# Patient Record
Sex: Female | Born: 1990 | Race: White | Hispanic: No | Marital: Married | Smoking: Never smoker
Health system: Southern US, Community
[De-identification: ages and names within clinical notes are randomized; demographics above are authoritative.]

## PROBLEM LIST (undated history)

## (undated) DIAGNOSIS — R768 Other specified abnormal immunological findings in serum: Secondary | ICD-10-CM

## (undated) DIAGNOSIS — T7840XA Allergy, unspecified, initial encounter: Secondary | ICD-10-CM

## (undated) HISTORY — PX: UPPER GI ENDOSCOPY: SHX6162

## (undated) HISTORY — DX: Other specified abnormal immunological findings in serum: R76.8

## (undated) HISTORY — DX: Allergy, unspecified, initial encounter: T78.40XA

## (undated) HISTORY — PX: WISDOM TOOTH EXTRACTION: SHX21

---

## 2017-08-04 ENCOUNTER — Other Ambulatory Visit: Payer: Self-pay | Admitting: Obstetrics & Gynecology

## 2017-08-04 NOTE — Telephone Encounter (Signed)
Spoke with pateint and [facility] chart has not arrived, will have chart sent, so refill can be sent. Annual scheduled in August.

## 2017-08-05 NOTE — Telephone Encounter (Signed)
Chart arrived annual on 11/10/17

## 2017-11-10 ENCOUNTER — Encounter: Payer: Self-pay | Admitting: Obstetrics & Gynecology

## 2017-11-10 ENCOUNTER — Ambulatory Visit: Payer: BC Managed Care – PPO | Admitting: Obstetrics & Gynecology

## 2017-11-10 VITALS — BP 110/68 | Ht 65.0 in | Wt 115.2 lb

## 2017-11-10 DIAGNOSIS — Z3041 Encounter for surveillance of contraceptive pills: Secondary | ICD-10-CM

## 2017-11-10 DIAGNOSIS — Z01419 Encounter for gynecological examination (general) (routine) without abnormal findings: Secondary | ICD-10-CM | POA: Diagnosis not present

## 2017-11-10 MED ORDER — NORETHIN-ETH ESTRAD-FE BIPHAS 1 MG-10 MCG / 10 MCG PO TABS: 1.0000 | ORAL_TABLET | Freq: Every day | ORAL | 4 refills | Status: DC

## 2017-11-10 NOTE — Progress Notes (Signed)
Crystal Wagner 1991/03/22 [id number]   History:    27 y.o. G0 Married.  3rd grade teacher.   RP:  Established patient presenting for annual gyn exam   HPI: Frequent nausea on Blisovi 24 Fe 1/20.  Very compliant.  Would like a lower Estradiol pill as she was actually vomiting when on a 30-35 microgram pill in the past.  No BTB.  No pelvic pain.  Urine/BMs wnl.  Breasts wnl.  BMI 19.17.  Gym 4-5 times per week.  Past medical history,surgical history, family history and social history were all reviewed and documented in the EPIC chart.  Gynecologic History Patient's last menstrual period was 10/27/2017. Contraception: OCP (estrogen/progesterone) Last Pap: 05/2016. Results were: Negative Last mammogram: Never Bone Density: Never Colonoscopy: Never  Obstetric History OB History  Gravida Para Term Preterm AB Living  0 0 0 0 0 0  SAB TAB Ectopic Multiple Live Births  0 0 0 0 0     ROS: A ROS was performed and pertinent positives and negatives are included in the history.  GENERAL: No fevers or chills. HEENT: No change in vision, no earache, sore throat or sinus congestion. NECK: No pain or stiffness. CARDIOVASCULAR: No chest pain or pressure. No palpitations. PULMONARY: No shortness of breath, cough or wheeze. GASTROINTESTINAL: No abdominal pain, nausea, vomiting or diarrhea, melena or bright red blood per rectum. GENITOURINARY: No urinary frequency, urgency, hesitancy or dysuria. MUSCULOSKELETAL: No joint or muscle pain, no back pain, no recent trauma. DERMATOLOGIC: No rash, no itching, no lesions. ENDOCRINE: No polyuria, polydipsia, no heat or cold intolerance. No recent change in weight. HEMATOLOGICAL: No anemia or easy bruising or bleeding. NEUROLOGIC: No headache, seizures, numbness, tingling or weakness. PSYCHIATRIC: No depression, no loss of interest in normal activity or change in sleep pattern.     Exam:   BP 110/68   Ht 5\' 5"  (1.651 m)   Wt 115 lb 3.2 oz (52.3 kg)   LMP  10/27/2017 Comment: blisovi  BMI 19.17 kg/m   Body mass index is 19.17 kg/m.  General appearance : Well developed well nourished female. No acute distress HEENT: Eyes: no retinal hemorrhage or exudates,  Neck supple, trachea midline, no carotid bruits, no thyroidmegaly Lungs: Clear to auscultation, no rhonchi or wheezes, or rib retractions  Heart: Regular rate and rhythm, no murmurs or gallops Breast:Examined in sitting and supine position were symmetrical in appearance, no palpable masses or tenderness,  no skin retraction, no nipple inversion, no nipple discharge, no skin discoloration, no axillary or supraclavicular lymphadenopathy Abdomen: no palpable masses or tenderness, no rebound or guarding Extremities: no edema or skin discoloration or tenderness  Pelvic: Vulva: Normal             Vagina: No gross lesions or discharge  Cervix: No gross lesions or discharge.  Pap reflex done.  Uterus  AV, normal size, shape and consistency, non-tender and mobile  Adnexa  Without masses or tenderness  Anus: Normal   Assessment/Plan:  27 y.o. female for annual exam   1. Well female exam with routine gynecological exam  Normal gynecologic exam.  Pap reflex done.  Breast exam normal.   Body mass index 19.17.  Patient is fit and has a healthy nutrition.  2. Encounter for surveillance of contraceptive pills Changed BCPs to Lo LoEstrin Fe because patient had nausea on her 20 microgram pill.  Previously had vomiting on a 30-35 mcg pill.  No contraindication to birth control pills.  Prescription of Lo Loestrin  Fe sent to pharmacy.  Other orders - Norethindrone-Ethinyl Estradiol-Fe Biphas (LO LOESTRIN FE) 1 MG-10 MCG / 10 MCG tablet; Take 1 tablet by mouth daily.  Genia DelMarie-Lyne Sylvanus Telford MD, 3:42 PM 11/10/2017

## 2017-11-10 NOTE — Patient Instructions (Signed)
1. Well female exam with routine gynecological exam  Normal gynecologic exam.  Pap reflex done.  Breast exam normal.   Body mass index 19.17.  Patient is fit and has a healthy nutrition.  2. Encounter for surveillance of contraceptive pills Changed BCPs to Lo LoEstrin Fe because patient had nausea on her 20 microgram pill.  Previously had vomiting on a 30-35 mcg pill.  No contraindication to birth control pills.  Prescription of Lo Loestrin Fe sent to pharmacy.  Other orders - Norethindrone-Ethinyl Estradiol-Fe Biphas (LO LOESTRIN FE) 1 MG-10 MCG / 10 MCG tablet; Take 1 tablet by mouth daily.  Crystal Wagner, it was a pleasure seeing you today!  I will inform you of your results as soon as they are available.

## 2017-11-11 LAB — PAP IG W/ RFLX HPV ASCU

## 2018-01-03 ENCOUNTER — Other Ambulatory Visit: Payer: Self-pay | Admitting: Obstetrics & Gynecology

## 2018-07-23 ENCOUNTER — Other Ambulatory Visit: Payer: Self-pay | Admitting: Obstetrics & Gynecology

## 2018-10-16 ENCOUNTER — Other Ambulatory Visit: Payer: Self-pay | Admitting: Obstetrics & Gynecology

## 2018-11-10 ENCOUNTER — Ambulatory Visit: Payer: BC Managed Care – PPO | Admitting: Obstetrics & Gynecology

## 2018-11-10 ENCOUNTER — Telehealth: Payer: Self-pay | Admitting: *Deleted

## 2018-11-10 ENCOUNTER — Other Ambulatory Visit: Payer: Self-pay

## 2018-11-10 ENCOUNTER — Emergency Department
Admission: EM | Admit: 2018-11-10 | Discharge: 2018-11-10 | Disposition: A | Discharge status: Home / Self Care | Payer: BC Managed Care – PPO | Source: Home / Self Care

## 2018-11-10 DIAGNOSIS — R112 Nausea with vomiting, unspecified: Secondary | ICD-10-CM | POA: Diagnosis not present

## 2018-11-10 DIAGNOSIS — R6883 Chills (without fever): Secondary | ICD-10-CM

## 2018-11-10 DIAGNOSIS — R197 Diarrhea, unspecified: Secondary | ICD-10-CM

## 2018-11-10 MED ORDER — ONDANSETRON 4 MG PO TBDP: 4.0000 mg | ORAL_TABLET | Freq: Three times a day (TID) | ORAL | 0 refills | Status: DC | PRN

## 2018-11-10 MED ORDER — ONDANSETRON 4 MG PO TBDP: 4.0000 mg | ORAL_TABLET | Freq: Once | ORAL | Status: DC

## 2018-11-10 NOTE — Telephone Encounter (Signed)
Patient called c/o nausea and chills x4 days,no temperature this am woke up with bleeding and clots. She said on 11/07/18 had vomiting and her birth pill probably came up due to this and has not been feeling well since then. No missed pill besides the vomiting, negative UPT. She has been taking lo Loestrin 1/10 mcg since 10/2017 and do well with this did mention some nausea in June/July but no vomiting, had negative covid testing on 11/08/18, No PCP. She was not having heavy bleeding now, only when she went to the bathroom, she report the bleeding was heavy then her bleeding before with cycles. No abdominal pain, notes cramping. I advised her to go to urgent care to address the nausea and chills and monitor the bleeding for now could be due the vomiting and pill coming up. She agreed with this and will follow up if needed for the bleeding.

## 2018-11-10 NOTE — ED Triage Notes (Signed)
Pt states that since Sunday night has had nausea, chills.  Flew on Thursday.  Vomited Monday night, diarrhea on Tuesday night.  Tested for COVID on Tuesday, Negative results

## 2018-11-10 NOTE — ED Provider Notes (Signed)
[facility]    CSN: [id number] Arrival date & time: 11/10/18  0947     History   Chief Complaint Chief Complaint  Patient presents with  . Nausea  . Chills  . Fever    HPI Aleicia Kenagy is a 28 y.o. female.   HPI Melodye Swor is a 28 y.o. female presenting to UC with c/o chills, nausea, vomiting and diarrhea that started 3 nights ago with vomiting Monday night, diarrhea Tuesday night. She reports flying out to [place] 1 week ago, returning Thursday 11/03/2018 last week.  She was tested for Covid in a drive-through 2 nights ago, it was negative.  She had one episode of diarrhea last night but no vomiting or diarrhea since then.  Denies fever, cough, congestion, abdominal pain or urinary symptoms. LMP was about 1 month ago but she has been spotting the last few days, she believes due to vomiting her birth control.  She took a home pregnancy test, which was negative.  No known sick contacts.    History reviewed. No pertinent past medical history.  There are no active problems to display for this patient.   Past Surgical History:  Procedure Laterality Date  . WISDOM TOOTH EXTRACTION      OB History    Gravida  0   Para  0   Term  0   Preterm  0   AB  0   Living  0     SAB  0   TAB  0   Ectopic  0   Multiple  0   Live Births  0            Home Medications    Prior to Admission medications   Medication Sig Start Date End Date Taking? Authorizing Provider  BLISOVI 24 FE 1-20 MG-MCG(24) tablet TAKE 1 TABLET BY MOUTH EVERY DAY 07/25/18   Princess Bruins, MD  LO LOESTRIN FE 1 MG-10 MCG / 10 MCG tablet TAKE 1 TABLET BY MOUTH EVERY DAY 10/17/18   Princess Bruins, MD  ondansetron (ZOFRAN ODT) 4 MG disintegrating tablet Take 1 tablet (4 mg total) by mouth every 8 (eight) hours as needed. 11/10/18   Noe Gens, PA-C    Family History Family History  Problem Relation Age of Onset  . Diabetes Maternal Aunt     Social History  Social History   Tobacco Use  . Smoking status: Never Smoker  . Smokeless tobacco: Never Used  Substance Use Topics  . Alcohol use: Yes    Comment: occ  . Drug use: Not on file     Allergies   Patient has no known allergies.   Review of Systems Review of Systems  Constitutional: Positive for chills. Negative for fever.  HENT: Negative for congestion, ear pain, sneezing and sore throat.   Respiratory: Negative for cough and chest tightness.   Gastrointestinal: Positive for diarrhea, nausea and vomiting. Negative for abdominal pain.  Genitourinary: Negative for dysuria, flank pain, frequency and hematuria.  Musculoskeletal: Negative for arthralgias, back pain and myalgias.  Neurological: Negative for dizziness, light-headedness and headaches.     Physical Exam Triage Vital Signs ED Triage Vitals  Enc Vitals Group     BP 11/10/18 1027 108/77     Pulse Rate 11/10/18 1027 100     Resp 11/10/18 1027 20     Temp 11/10/18 1027 98.2 F (36.8 C)     Temp Source 11/10/18 1027 Oral     SpO2 11/10/18 1027  98 %     Weight 11/10/18 1030 115 lb (52.2 kg)     Height 11/10/18 1030 5\' 5"  (1.651 m)     Head Circumference --      Peak Flow --      Pain Score 11/10/18 1029 2     Pain Loc --      Pain Edu? --      Excl. in GC? --    No data found.  Updated Vital Signs BP 108/77 (BP Location: Right Arm)   Pulse 100   Temp 98.2 F (36.8 C) (Oral)   Resp 20   Ht 5\' 5"  (1.651 m)   Wt 115 lb (52.2 kg)   LMP 11/10/2018   SpO2 98%   BMI 19.14 kg/m   Visual Acuity Right Eye Distance:   Left Eye Distance:   Bilateral Distance:    Right Eye Near:   Left Eye Near:    Bilateral Near:     Physical Exam Vitals signs and nursing note reviewed.  Constitutional:      Appearance: Normal appearance. She is well-developed.  HENT:     Head: Normocephalic and atraumatic.     Mouth/Throat:     Mouth: Mucous membranes are moist.  Eyes:     General: No scleral icterus.     Extraocular Movements: Extraocular movements intact.     Conjunctiva/sclera: Conjunctivae normal.  Neck:     Musculoskeletal: Normal range of motion.  Cardiovascular:     Rate and Rhythm: Normal rate and regular rhythm.  Pulmonary:     Effort: Pulmonary effort is normal. No respiratory distress.     Breath sounds: Normal breath sounds. No stridor. No wheezing, rhonchi or rales.  Abdominal:     General: There is no distension.     Palpations: Abdomen is soft.     Tenderness: There is no abdominal tenderness. There is no right CVA tenderness or left CVA tenderness.  Musculoskeletal: Normal range of motion.  Skin:    General: Skin is warm and dry.  Neurological:     Mental Status: She is alert and oriented to person, place, and time.  Psychiatric:        Behavior: Behavior normal.      UC Treatments / Results  Labs (all labs ordered are listed, but only abnormal results are displayed) Labs Reviewed - No data to display  EKG   Radiology No results found.  Procedures Procedures (including critical care time)  Medications Ordered in UC Medications - No data to display  Initial Impression / Assessment and Plan / UC Course  I have reviewed the triage vital signs and the nursing notes.  Pertinent labs & imaging results that were available during my care of the patient were reviewed by me and considered in my medical decision making (see chart for details).     Pt appears well, NAD Benign abdominal exam. Reassuring symptoms have been improving. No vomiting or diarrhea today. Encouraged good hydration. Offered to repeat Covid or pregnancy test. Pt declined. AVS provided F/u as needed.  Final Clinical Impressions(s) / UC Diagnoses   Final diagnoses:  Nausea vomiting and diarrhea  Chills (without fever)     Discharge Instructions      No Primary Care Doctor: - Call [facility] at  [phone] - they can help you locate a primary care doctor that  accepts your  insurance, provides certain services, etc. - Physician Referral Service- [phone]   Be sure to get a lot  of rest and stay well hydrated with sports drinks, water, diluted juices, and clear sodas.  Avoid fried fatty food, spicy food, and milk as these foods can cause worsening stomach upset.   Please follow up in 3-4 days if still not improving, sooner if symptoms worsening or new symptoms develop- fever, abdominal pain, urinary symptoms, dizziness/passing out.   If you are still concerned for pregnancy or Covid, it is recommended you are retested in 3-4 days as tests should be more accurate by this time.       ED Prescriptions    Medication Sig Dispense Auth. Provider   ondansetron (ZOFRAN ODT) 4 MG disintegrating tablet  (Status: Discontinued) Take 1 tablet (4 mg total) by mouth every 8 (eight) hours as needed. 8 tablet Doroteo GlassmanPhelps, Khira Cudmore O, PA-C   ondansetron (ZOFRAN ODT) 4 MG disintegrating tablet Take 1 tablet (4 mg total) by mouth every 8 (eight) hours as needed. 8 tablet Lurene ShadowPhelps, Persephone Schriever O, PA-C     Controlled Substance Prescriptions [place] Controlled Substance Registry consulted? Not Applicable   Rolla Platehelps, Dannah Ryles O, PA-C 11/10/18 1426

## 2018-11-10 NOTE — Discharge Instructions (Signed)
°  No Primary Care Doctor: Call [facility] at  [phone] - they can help you locate a primary care doctor that  accepts your insurance, provides certain services, etc. Physician Referral Service- [phone]   Be sure to get a lot of rest and stay well hydrated with sports drinks, water, diluted juices, and clear sodas.  Avoid fried fatty food, spicy food, and milk as these foods can cause worsening stomach upset.   Please follow up in 3-4 days if still not improving, sooner if symptoms worsening or new symptoms develop- fever, abdominal pain, urinary symptoms, dizziness/passing out.   If you are still concerned for pregnancy or Covid, it is recommended you are retested in 3-4 days as tests should be more accurate by this time.

## 2018-11-14 ENCOUNTER — Ambulatory Visit (INDEPENDENT_AMBULATORY_CARE_PROVIDER_SITE_OTHER): Payer: BC Managed Care – PPO | Admitting: Nurse Practitioner

## 2018-11-14 ENCOUNTER — Other Ambulatory Visit: Payer: Self-pay

## 2018-11-14 ENCOUNTER — Encounter: Payer: Self-pay | Admitting: Nurse Practitioner

## 2018-11-14 VITALS — Temp 97.5°F | Ht 65.0 in | Wt 108.8 lb

## 2018-11-14 DIAGNOSIS — K529 Noninfective gastroenteritis and colitis, unspecified: Secondary | ICD-10-CM

## 2018-11-14 MED ORDER — DIPHENOXYLATE-ATROPINE 2.5-0.025 MG/5ML PO LIQD: 5.0000 mL | Freq: Four times a day (QID) | ORAL | 0 refills | Status: DC | PRN

## 2018-11-14 NOTE — Progress Notes (Signed)
Virtual Visit via Video Note  I connected with Crystal Wagner on 11/14/18 at 11:00 AM EDT by a video enabled telemedicine application and verified that I am speaking with the correct person using two identifiers.  Location: Patient: home Provider: office   I discussed the limitations of evaluation and management by telemedicine and the availability of in person appointments. The patient expressed understanding and agreed to proceed.  CC: diarrhea, nausea, ABD pain x 1week.  History of Present Illness: GI Problem The primary symptoms include abdominal pain, nausea and diarrhea. Primary symptoms do not include fever, weight loss, fatigue, vomiting, melena, hematemesis, jaundice, hematochezia, dysuria, myalgias, arthralgias or rash. The illness began 6 to 7 days ago. The onset was gradual. The problem has not changed since onset. The abdominal pain began more than 2 days ago. The abdominal pain has been unchanged since its onset. The abdominal pain is generalized. The abdominal pain does not radiate. Eating relieves the abdominal pain.  Nausea began 6 to 7 days ago. The nausea is associated with eating.  The diarrhea began 6 to 7 days ago. The diarrhea is watery. The diarrhea occurs 2 to 4 times per day.  The illness does not include chills, anorexia, bloating, tenesmus or back pain. Associated medical issues do not include inflammatory bowel disease, GERD, gallstones, liver disease, alcohol abuse, PUD, gastric bypass, irritable bowel syndrome, hemorrhoids or diverticulitis.  negative COVID test. No known sick contact. Use of zofran as needed with some relief. No antidiarrhea medication used.   Reviewed medical, family, surgical and social history.  Observations/Objective: Physical Exam  Constitutional: She is oriented to person, place, and time. No distress.  Pulmonary/Chest: Effort normal.  Abdominal: She exhibits no distension.  Neurological: She is alert and oriented to person,  place, and time.  Skin: No rash noted.   Assessment and Plan: Patina was seen today for establish care.  Diagnoses and all orders for this visit:  Gastroenteritis -     diphenoxylate-atropine (LOMOTIL) 2.5-0.025 MG/5ML liquid; Take 5 mLs by mouth 4 (four) times daily as needed for diarrhea or loose stools.   Follow Up Instructions: See avs   I discussed the assessment and treatment plan with the patient. The patient was provided an opportunity to ask questions and all were answered. The patient agreed with the plan and demonstrated an understanding of the instructions.   The patient was advised to call back or seek an in-person evaluation if the symptoms worsen or if the condition fails to improve as anticipated.   Wilfred Lacy, NP

## 2018-11-14 NOTE — Patient Instructions (Signed)
Maintain adequate oral hydration and BRAT diet for 3days. Slowly advance diet as tolerated.  Viral Gastroenteritis, Adult  Viral gastroenteritis is also known as the stomach flu. This condition may affect your stomach, your small intestine, and your large intestine. It can cause sudden watery poop (diarrhea), fever, and throwing up (vomiting). This condition is caused by certain germs (viruses). These germs can be passed from person to person very easily (are contagious). Having watery poop and throwing up can make you feel weak and cause you to not have enough water in your body (get dehydrated). This can make you tired and thirsty, make you have a dry mouth, and make it so you pee (urinate) less often. It is important to replace the fluids that you lose from having watery poop and throwing up. What are the causes?  You can get sick by catching viruses from other people.  You can also get sick by: ? Eating food, drinking water, or touching a surface that has the viruses on it (is contaminated). ? Sharing utensils or other personal items with a person who is sick. What increases the risk?  Having a weak body defense system (immune system).  Living with one or more children who are younger than 27 years old.  Living in a nursing home.  Going on cruise ships. What are the signs or symptoms? Symptoms of this condition start suddenly. Symptoms may last for a few days or for as long as a week.  Common symptoms include: ? Watery poop. ? Throwing up.  Other symptoms include: ? Fever. ? Headache. ? Feeling tired (fatigue). ? Pain in the belly (abdomen). ? Chills. ? Feeling weak. ? Feeling sick to your stomach (nauseous). ? Muscle aches. ? Not feeling hungry. How is this treated?  This condition typically goes away on its own.  The focus of treatment is to replace the fluids that you lose. This condition may be treated with: ? An ORS (oral rehydration solution). This is a drink  that is sold at pharmacies and stores. ? Medicines to help with your symptoms. ? Probiotic supplements to reduce symptoms of diarrhea. ? Fluids given through an IV tube, if needed.  Older adults and people with other diseases or a weak body defense system are at higher risk for not having enough water in the body. Follow these instructions at home: Eating and drinking   Take an ORS as told by your doctor.  Drink clear fluids in small amounts as you are able. Clear fluids include: ? Water. ? Ice chips. ? Fruit juice with water added to it (diluted). ? Low-calorie sports drinks.  Drink enough fluid to keep your pee (urine) pale yellow.  Eat small amounts of healthy foods every 3-4 hours as you are able. This may include whole grains, fruits, vegetables, lean meats, and yogurt.  Avoid fluids that have a lot of sugar or caffeine in them, such as energy drinks, sports drinks, and soda.  Avoid spicy or fatty foods.  Avoid alcohol. General instructions   Wash your hands often. This is very important after you have watery poop or you throw up. If you cannot use soap and water, use hand sanitizer.  Make sure that all people in your home wash their hands well and often.  Take over-the-counter and prescription medicines only as told by your doctor.  Rest at home while you get better.  Watch your condition for any changes.  Take a warm bath to help with any burning or  pain from having watery poop.  Keep all follow-up visits as told by your doctor. This is important. Contact a doctor if:  You cannot keep fluids down.  Your symptoms get worse.  You have new symptoms.  You feel light-headed.  You feel dizzy.  You have muscle cramps. Get help right away if:  You have chest pain.  You feel very weak.  You pass out (faint).  You see blood in your throw-up.  Your throw-up looks like coffee grounds.  You have bloody or black poop (stools) or poop that looks like tar.   You have a very bad headache, or a stiff neck, or both.  You have a rash.  You have very bad pain, cramping, or bloating in your belly.  You have trouble breathing.  You are breathing very quickly.  You have a fast heartbeat.  Your skin feels cold and clammy.  You feel mixed up (confused).  You have pain when you pee.  You have signs of not having enough water in the body, such as: ? Dark pee, hardly any pee, or no pee. ? Cracked lips. ? Dry mouth. ? Sunken eyes. ? Feeling very sleepy. ? Feeling weak. Summary  Viral gastroenteritis is also known as the stomach flu.  This condition can cause sudden watery poop (diarrhea), fever, and throwing up (vomiting).  These germs can be passed from person to person very easily.  Take an ORS as told by your doctor. This is a drink that is sold at pharmacies and stores.  Drink fluids in small amounts many times each day as you are able. This information is not intended to replace advice given to you by your health care provider. Make sure you discuss any questions you have with your health care provider. Document Released: 09/02/2007 Document Revised: 01/19/2018 Document Reviewed: 01/19/2018 Elsevier Patient Education  2020 [employer]    Food Choices to Help Relieve Diarrhea, Adult When you have diarrhea, the foods you eat and your eating habits are very important. Choosing the right foods and drinks can help:  Relieve diarrhea.  Replace lost fluids and nutrients.  Prevent dehydration. What general guidelines should I follow?  Relieving diarrhea  Choose foods with less than 2 g or .07 oz. of fiber per serving.  Limit fats to less than 8 tsp (38 g or 1.34 oz.) a day.  Avoid the following: ? Foods and beverages sweetened with high-fructose corn syrup, honey, or sugar alcohols such as xylitol, sorbitol, and mannitol. ? Foods that contain a lot of fat or sugar. ? Fried, greasy, or spicy foods. ? High-fiber grains,  breads, and cereals. ? Raw fruits and vegetables.  Eat foods that are rich in probiotics. These foods include dairy products such as yogurt and fermented milk products. They help increase healthy bacteria in the stomach and intestines (gastrointestinal tract, or GI tract).  If you have lactose intolerance, avoid dairy products. These may make your diarrhea worse.  Take medicine to help stop diarrhea (antidiarrheal medicine) only as told by your health care provider. Replacing nutrients  Eat small meals or snacks every 3-4 hours.  Eat bland foods, such as white rice, toast, or baked potato, until your diarrhea starts to get better. Gradually reintroduce nutrient-rich foods as tolerated or as told by your health care provider. This includes: ? Well-cooked protein foods. ? Peeled, seeded, and soft-cooked fruits and vegetables. ? Low-fat dairy products.  Take vitamin and mineral supplements as told by your health care provider. Preventing  dehydration  Start by sipping water or a special solution to prevent dehydration (oral rehydration solution, ORS). Urine that is clear or pale yellow means that you are getting enough fluid.  Try to drink at least 8-10 cups of fluid each day to help replace lost fluids.  You may add other liquids in addition to water, such as clear juice or decaffeinated sports drinks, as tolerated or as told by your health care provider.  Avoid drinks with caffeine, such as coffee, tea, or soft drinks.  Avoid alcohol. What foods are recommended?     The items listed may not be a complete list. Talk with your health care provider about what dietary choices are best for you. Grains White rice. White, [place]French, or pita breads (fresh or toasted), including plain rolls, buns, or bagels. White pasta. Saltine, soda, or graham crackers. Pretzels. Low-fiber cereal. Cooked cereals made with water (such as cornmeal, farina, or cream cereals). Plain muffins. Matzo. Melba toast.  Zwieback. Vegetables Potatoes (without the skin). Most well-cooked and canned vegetables without skins or seeds. Tender lettuce. Fruits Apple sauce. Fruits canned in juice. Cooked apricots, cherries, grapefruit, peaches, pears, or plums. Fresh bananas and cantaloupe. Meats and other protein foods Baked or boiled chicken. Eggs. Tofu. Fish. Seafood. Smooth nut butters. Ground or well-cooked tender beef, ham, veal, lamb, pork, or poultry. Dairy Plain yogurt, kefir, and unsweetened liquid yogurt. Lactose-free milk, buttermilk, skim milk, or soy milk. Low-fat or nonfat hard cheese. Beverages Water. Low-calorie sports drinks. Fruit juices without pulp. Strained tomato and vegetable juices. Decaffeinated teas. Sugar-free beverages not sweetened with sugar alcohols. Oral rehydration solutions, if approved by your health care provider. Seasoning and other foods Bouillon, broth, or soups made from recommended foods. What foods are not recommended? The items listed may not be a complete list. Talk with your health care provider about what dietary choices are best for you. Grains Whole grain, whole wheat, bran, or rye breads, rolls, pastas, and crackers. Wild or brown rice. Whole grain or bran cereals. Barley. Oats and oatmeal. Corn tortillas or taco shells. Granola. Popcorn. Vegetables Raw vegetables. Fried vegetables. Cabbage, broccoli, Brussels sprouts, artichokes, baked beans, beet greens, corn, kale, legumes, peas, sweet potatoes, and yams. Potato skins. Cooked spinach and cabbage. Fruits Dried fruit, including raisins and dates. Raw fruits. Stewed or dried prunes. Canned fruits with syrup. Meat and other protein foods Fried or fatty meats. Deli meats. Chunky nut butters. Nuts and seeds. Beans and lentils. Tomasa BlaseBacon. Hot dogs. Sausage. Dairy High-fat cheeses. Whole milk, chocolate milk, and beverages made with milk, such as milk shakes. Half-and-half. Cream. sour cream. Ice cream. Beverages  Caffeinated beverages (such as coffee, tea, soda, or energy drinks). Alcoholic beverages. Fruit juices with pulp. Prune juice. Soft drinks sweetened with high-fructose corn syrup or sugar alcohols. High-calorie sports drinks. Fats and oils Butter. Cream sauces. Margarine. Salad oils. Plain salad dressings. Olives. Avocados. Mayonnaise. Sweets and desserts Sweet rolls, doughnuts, and sweet breads. Sugar-free desserts sweetened with sugar alcohols such as xylitol and sorbitol. Seasoning and other foods Honey. Hot sauce. Chili powder. Gravy. Cream-based or milk-based soups. Pancakes and waffles. Summary  When you have diarrhea, the foods you eat and your eating habits are very important.  Make sure you get at least 8-10 cups of fluid each day, or enough to keep your urine clear or pale yellow.  Eat bland foods and gradually reintroduce healthy, nutrient-rich foods as tolerated, or as told by your health care provider.  Avoid high-fiber, fried, greasy, or spicy  foods. This information is not intended to replace advice given to you by your health care provider. Make sure you discuss any questions you have with your health care provider. Document Released: 06/06/2003 Document Revised: 07/07/2018 Document Reviewed: 03/13/2016 Elsevier Patient Education  2020 [employer].

## 2018-11-15 ENCOUNTER — Encounter: Payer: Self-pay | Admitting: Nurse Practitioner

## 2018-11-16 ENCOUNTER — Encounter: Payer: BC Managed Care – PPO | Admitting: Obstetrics & Gynecology

## 2018-11-21 ENCOUNTER — Other Ambulatory Visit: Payer: BC Managed Care – PPO

## 2018-11-21 ENCOUNTER — Other Ambulatory Visit: Payer: Self-pay

## 2018-11-21 ENCOUNTER — Telehealth (INDEPENDENT_AMBULATORY_CARE_PROVIDER_SITE_OTHER): Payer: BC Managed Care – PPO | Admitting: Nurse Practitioner

## 2018-11-21 ENCOUNTER — Encounter: Payer: Self-pay | Admitting: Nurse Practitioner

## 2018-11-21 VITALS — Temp 97.5°F | Ht 65.0 in | Wt 106.2 lb

## 2018-11-21 DIAGNOSIS — R112 Nausea with vomiting, unspecified: Secondary | ICD-10-CM | POA: Diagnosis not present

## 2018-11-21 DIAGNOSIS — R1084 Generalized abdominal pain: Secondary | ICD-10-CM | POA: Diagnosis not present

## 2018-11-21 LAB — CBC WITH DIFFERENTIAL/PLATELET
Basophils Absolute: 0.1 10*3/uL (ref 0.0–0.1)
Basophils Relative: 1.1 % (ref 0.0–3.0)
Eosinophils Absolute: 0 10*3/uL (ref 0.0–0.7)
Eosinophils Relative: 0.5 % (ref 0.0–5.0)
HCT: 41.9 % (ref 36.0–46.0)
Hemoglobin: 13.9 g/dL (ref 12.0–15.0)
Lymphocytes Relative: 22.8 % (ref 12.0–46.0)
Lymphs Abs: 1.3 10*3/uL (ref 0.7–4.0)
MCHC: 33.3 g/dL (ref 30.0–36.0)
MCV: 86.1 fl (ref 78.0–100.0)
Monocytes Absolute: 0.3 10*3/uL (ref 0.1–1.0)
Monocytes Relative: 5.3 % (ref 3.0–12.0)
Neutro Abs: 4 10*3/uL (ref 1.4–7.7)
Neutrophils Relative %: 70.3 % (ref 43.0–77.0)
Platelets: 243 10*3/uL (ref 150.0–400.0)
RBC: 4.86 Mil/uL (ref 3.87–5.11)
RDW: 12.4 % (ref 11.5–15.5)
WBC: 5.7 10*3/uL (ref 4.0–10.5)

## 2018-11-21 LAB — COMPREHENSIVE METABOLIC PANEL
ALT: 22 U/L (ref 0–35)
AST: 17 U/L (ref 0–37)
Albumin: 4.7 g/dL (ref 3.5–5.2)
Alkaline Phosphatase: 35 U/L — ABNORMAL LOW (ref 39–117)
BUN: 13 mg/dL (ref 6–23)
CO2: 27 mEq/L (ref 19–32)
Calcium: 9.3 mg/dL (ref 8.4–10.5)
Chloride: 102 mEq/L (ref 96–112)
Creatinine, Ser: 0.91 mg/dL (ref 0.40–1.20)
GFR: 74.14 mL/min (ref >60.00)
Glucose, Bld: 112 mg/dL — ABNORMAL HIGH (ref 70–99)
Potassium: 3.7 mEq/L (ref 3.5–5.1)
Sodium: 137 mEq/L (ref 135–145)
Total Bilirubin: 0.4 mg/dL (ref 0.2–1.2)
Total Protein: 7 g/dL (ref 6.0–8.3)

## 2018-11-21 LAB — LIPASE: Lipase: 6 U/L — ABNORMAL LOW (ref 11.0–59.0)

## 2018-11-21 MED ORDER — FAMOTIDINE 20 MG PO TABS: 20.0000 mg | ORAL_TABLET | Freq: Two times a day (BID) | ORAL | 0 refills | Status: DC

## 2018-11-21 MED ORDER — ONDANSETRON 4 MG PO TBDP: 4.0000 mg | ORAL_TABLET | Freq: Three times a day (TID) | ORAL | 0 refills | Status: DC | PRN

## 2018-11-21 NOTE — Patient Instructions (Addendum)
Go to lab at 11am for blood draw. You will be contacted to schedule ABD [place]. Zofran and Pepcid sent to your pharmacy. Continue bland diet.

## 2018-11-21 NOTE — Progress Notes (Signed)
Virtual Visit via Video Note  I connected with Quita Skye on 11/21/18 at  9:00 AM EDT by a video enabled telemedicine application and verified that I am speaking with the correct person using two identifiers.  Location: Patient: home Provider: office   I discussed the limitations of evaluation and management by telemedicine and the availability of in person appointments. The patient expressed understanding and agreed to proceed.  CC:pt is c/o abd pain,cant eat,nausea, throat fullness/2 wks/[facility]--got zofran--FYI--just came back from [facility] 8/1-8/6. and tested negataive for covid 11/08/2018   History of Present Illness: Abdominal Pain This is a new problem. The current episode started 1 to 4 weeks ago. The onset quality is gradual. The problem occurs constantly. The problem has been unchanged. The pain is located in the generalized abdominal region. The quality of the pain is colicky and a sensation of fullness. The abdominal pain does not radiate. Associated symptoms include anorexia, nausea and vomiting. Pertinent negatives include no constipation, diarrhea, fever, frequency, headaches, hematochezia, hematuria, melena, myalgias or weight loss. The pain is aggravated by eating. The pain is relieved by nothing. Treatments tried: zofran. The treatment provided mild relief. There is no history of gallstones, GERD, irritable bowel syndrome or PUD.  denies any Upper respiratory symptoms, resolved diarrhea, no loss of taste or smell. Has chills and clammy sensation without fever. Able to maintain adequate oral hydration.   Observations/Objective: Physical Exam  Constitutional: She is oriented to person, place, and time. No distress.  Pulmonary/Chest: Effort normal.  Abdominal: She exhibits no distension.  Neurological: She is alert and oriented to person, place, and time.  Psychiatric: She has a normal mood and affect. Her behavior is normal.   Assessment and Plan: Aalani  was seen today for abdominal pain.  Diagnoses and all orders for this visit:  Non-intractable vomiting with nausea, unspecified vomiting type -     ondansetron (ZOFRAN ODT) 4 MG disintegrating tablet; Take 1 tablet (4 mg total) by mouth every 8 (eight) hours as needed. -     famotidine (PEPCID) 20 MG tablet; Take 1 tablet (20 mg total) by mouth 2 (two) times daily. -     CBC w/Diff -     Comprehensive metabolic panel -     Lipase -     US Abdomen Limited RUQ; Future  Generalized abdominal pain -     ondansetron (ZOFRAN ODT) 4 MG disintegrating tablet; Take 1 tablet (4 mg total) by mouth every 8 (eight) hours as needed. -     famotidine (PEPCID) 20 MG tablet; Take 1 tablet (20 mg total) by mouth 2 (two) times daily. -     US Abdomen Limited RUQ; Future    Follow Up Instructions: See avs   I discussed the assessment and treatment plan with the patient. The patient was provided an opportunity to ask questions and all were answered. The patient agreed with the plan and demonstrated an understanding of the instructions.   The patient was advised to call back or seek an in-person evaluation if the symptoms worsen or if the condition fails to improve as anticipated.  Wilfred Lacy, NP

## 2018-11-22 ENCOUNTER — Encounter: Payer: Self-pay | Admitting: Nurse Practitioner

## 2018-11-23 ENCOUNTER — Encounter: Payer: Self-pay | Admitting: Nurse Practitioner

## 2018-11-23 ENCOUNTER — Ambulatory Visit (HOSPITAL_COMMUNITY)
Admission: RE | Admit: 2018-11-23 | Discharge: 2018-11-23 | Disposition: A | Discharge status: Home / Self Care | Payer: BC Managed Care – PPO | Source: Ambulatory Visit | Attending: Nurse Practitioner | Admitting: Nurse Practitioner

## 2018-11-23 ENCOUNTER — Other Ambulatory Visit: Payer: Self-pay

## 2018-11-23 DIAGNOSIS — R112 Nausea with vomiting, unspecified: Secondary | ICD-10-CM | POA: Insufficient documentation

## 2018-11-23 DIAGNOSIS — R1084 Generalized abdominal pain: Secondary | ICD-10-CM | POA: Insufficient documentation

## 2018-11-27 ENCOUNTER — Encounter: Payer: Self-pay | Admitting: Nurse Practitioner

## 2018-11-30 ENCOUNTER — Encounter: Payer: BC Managed Care – PPO | Admitting: Nurse Practitioner

## 2018-12-07 ENCOUNTER — Encounter: Payer: BC Managed Care – PPO | Admitting: Nurse Practitioner

## 2018-12-07 ENCOUNTER — Telehealth: Payer: Self-pay | Admitting: Nurse Practitioner

## 2018-12-07 NOTE — Telephone Encounter (Signed)
Tried to call but had to leave message

## 2018-12-07 NOTE — Telephone Encounter (Signed)
-----   Message from Shawnie Pons, LPN sent at 05/06/6145  8:30 AM EDT ----- Please help call the pt and get CPE reschedule.   Thank you.

## 2018-12-12 ENCOUNTER — Encounter: Payer: Self-pay | Admitting: Nurse Practitioner

## 2018-12-12 ENCOUNTER — Ambulatory Visit: Payer: BC Managed Care – PPO | Admitting: Nurse Practitioner

## 2018-12-12 ENCOUNTER — Other Ambulatory Visit: Payer: Self-pay

## 2018-12-12 VITALS — BP 98/68 | HR 108 | Temp 97.9°F | Ht 65.0 in | Wt 108.0 lb

## 2018-12-12 DIAGNOSIS — R1013 Epigastric pain: Secondary | ICD-10-CM | POA: Diagnosis not present

## 2018-12-12 DIAGNOSIS — R112 Nausea with vomiting, unspecified: Secondary | ICD-10-CM

## 2018-12-12 LAB — POCT URINE PREGNANCY: Preg Test, Ur: NEGATIVE

## 2018-12-12 MED ORDER — ONDANSETRON 4 MG PO TBDP: 4.0000 mg | ORAL_TABLET | Freq: Three times a day (TID) | ORAL | 0 refills | Status: DC | PRN

## 2018-12-12 MED ORDER — FAMOTIDINE 20 MG PO TABS: 20.0000 mg | ORAL_TABLET | Freq: Two times a day (BID) | ORAL | 5 refills | Status: DC

## 2018-12-12 NOTE — Patient Instructions (Addendum)
Go to lab for blood draw and urine collection Also get stool kit for specimen collection. Return stool to lab As soon as possible. Resume pepcid BID and zofran as needed. Make changes to diet as directed below  Food Choices for Gastroesophageal Reflux Disease, Adult When you have gastroesophageal reflux disease (GERD), the foods you eat and your eating habits are very important. Choosing the right foods can help ease your discomfort. Think about working with a nutrition specialist (dietitian) to help you make good choices. What are tips for following this plan?  Meals  Choose healthy foods that are low in fat, such as fruits, vegetables, whole grains, low-fat dairy products, and lean meat, fish, and poultry.  Eat small meals often instead of 3 large meals a day. Eat your meals slowly, and in a place where you are relaxed. Avoid bending over or lying down until 2-3 hours after eating.  Avoid eating meals 2-3 hours before bed.  Avoid drinking a lot of liquid with meals.  Cook foods using methods other than frying. Bake, grill, or broil food instead.  Avoid or limit: ? Chocolate. ? Peppermint or spearmint. ? Alcohol. ? Pepper. ? Black and decaffeinated coffee. ? Black and decaffeinated tea. ? Bubbly (carbonated) soft drinks. ? Caffeinated energy drinks and soft drinks.  Limit high-fat foods such as: ? Fatty meat or fried foods. ? Whole milk, cream, butter, or ice cream. ? Nuts and nut butters. ? Pastries, donuts, and sweets made with butter or shortening.  Avoid foods that cause symptoms. These foods may be different for everyone. Common foods that cause symptoms include: ? Tomatoes. ? Oranges, lemons, and limes. ? Peppers. ? Spicy food. ? Onions and garlic. ? Vinegar. Lifestyle  Maintain a healthy weight. Ask your doctor what weight is healthy for you. If you need to lose weight, work with your doctor to do so safely.  Exercise for at least 30 minutes for 5 or more days  each week, or as told by your doctor.  Wear loose-fitting clothes.  Do not smoke. If you need help quitting, ask your doctor.  Sleep with the head of your bed higher than your feet. Use a wedge under the mattress or blocks under the bed frame to raise the head of the bed. Summary  When you have gastroesophageal reflux disease (GERD), food and lifestyle choices are very important in easing your symptoms.  Eat small meals often instead of 3 large meals a day. Eat your meals slowly, and in a place where you are relaxed.  Limit high-fat foods such as fatty meat or fried foods.  Avoid bending over or lying down until 2-3 hours after eating.  Avoid peppermint and spearmint, caffeine, alcohol, and chocolate. This information is not intended to replace advice given to you by your health care provider. Make sure you discuss any questions you have with your health care provider. Document Released: 09/15/2011 Document Revised: 07/07/2018 Document Reviewed: 04/21/2016 Elsevier Patient Education  2020 [employer].

## 2018-12-12 NOTE — Progress Notes (Signed)
Subjective:  Patient ID: Crystal Wagner, female    DOB: January 24, 1991  Age: 28 y.o. MRN: [id number]  CC: Nausea (pt is c/o nasea,vomit couple times and felt feverish/)  Abdominal Pain This is a recurrent problem. The current episode started more than 1 month ago. The onset quality is gradual. The problem occurs intermittently. The problem has been waxing and waning. The pain is located in the generalized abdominal region. The quality of the pain is colicky and a sensation of fullness. The abdominal pain does not radiate. Associated symptoms include anorexia, myalgias, nausea and vomiting. Pertinent negatives include no belching, constipation, diarrhea, fever, frequency, hematochezia, hematuria, melena or weight loss. The pain is aggravated by eating. The pain is relieved by nothing. She has tried H2 blockers for the symptoms. The treatment provided moderate relief. Prior diagnostic workup includes ultrasound. Her past medical history is significant for GERD. There is no history of irritable bowel syndrome.  normal ABD US done 10/2018.  Reviewed past Medical, Social and Family history today.  Outpatient Medications Prior to Visit  Medication Sig Dispense Refill  . LO LOESTRIN FE 1 MG-10 MCG / 10 MCG tablet TAKE 1 TABLET BY MOUTH EVERY DAY 84 tablet 0  . ondansetron (ZOFRAN ODT) 4 MG disintegrating tablet Take 1 tablet (4 mg total) by mouth every 8 (eight) hours as needed. 20 tablet 0  . famotidine (PEPCID) 20 MG tablet Take 1 tablet (20 mg total) by mouth 2 (two) times daily. (Patient not taking: Reported on 12/12/2018) 14 tablet 0   No facility-administered medications prior to visit.     ROS See HPI  Objective:  BP 98/68   Pulse (!) 108   Temp 97.9 F (36.6 C) (Tympanic)   Ht 5\' 5"  (1.651 m)   Wt 108 lb (49 kg)   SpO2 98%   BMI 17.97 kg/m   BP Readings from Last 3 Encounters:  12/12/18 98/68  11/10/18 108/77  11/10/17 110/68    Wt Readings from Last 3 Encounters:  12/12/18  108 lb (49 kg)  11/21/18 106 lb 3.2 oz (48.2 kg)  11/14/18 108 lb 12.8 oz (49.4 kg)    Physical Exam Vitals signs reviewed.  Constitutional:      General: She is not in acute distress. HENT:     Head: Normocephalic.  Cardiovascular:     Rate and Rhythm: Normal rate and regular rhythm.     Pulses: Normal pulses.     Heart sounds: Normal heart sounds.  Pulmonary:     Effort: Pulmonary effort is normal.     Breath sounds: Normal breath sounds.  Abdominal:     General: Bowel sounds are normal.     Palpations: Abdomen is soft.     Tenderness: There is no right CVA tenderness, left CVA tenderness or guarding.  Skin:    Findings: No rash.  Neurological:     Mental Status: She is alert and oriented to person, place, and time.  Psychiatric:        Mood and Affect: Mood normal.        Behavior: Behavior normal.        Thought Content: Thought content normal.     Lab Results  Component Value Date   WBC 5.7 11/21/2018   HGB 13.9 11/21/2018   HCT 41.9 11/21/2018   PLT 243.0 11/21/2018   GLUCOSE 112 (H) 11/21/2018   ALT 22 11/21/2018   AST 17 11/21/2018   NA 137 11/21/2018   K 3.7 11/21/2018  CL 102 11/21/2018   CREATININE 0.91 11/21/2018   BUN 13 11/21/2018   CO2 27 11/21/2018    US Abdomen Limited Ruq  Result Date: 11/23/2018 CLINICAL DATA:  28 year old female with abdominal pain and nausea for 2 weeks. EXAM: ULTRASOUND ABDOMEN LIMITED RIGHT UPPER QUADRANT COMPARISON:  None. FINDINGS: Gallbladder: No gallstones or wall thickening visualized. No sonographic Murphy sign noted by sonographer. Common bile duct: Diameter: 3 millimeters, normal. Liver: No focal lesion identified. Within normal limits in parenchymal echogenicity. Portal vein is patent on color Doppler imaging with normal direction of blood flow towards the liver. Other: Negative visible pancreas, right kidney. IMPRESSION: Normal right upper quadrant ultrasound. Electronically Signed   By: Genevie Ann M.D.   On:  11/23/2018 10:49    Assessment & Plan:   [name] was seen today for nausea.  Diagnoses and all orders for this visit:  Dyspepsia -     H Pylori, IGM, IGG, IGA AB -     H. pylori antigen, stool; Future -     famotidine (PEPCID) 20 MG tablet; Take 1 tablet (20 mg total) by mouth 2 (two) times daily. -     ondansetron (ZOFRAN ODT) 4 MG disintegrating tablet; Take 1 tablet (4 mg total) by mouth every 8 (eight) hours as needed. -     POCT urine pregnancy -     Hepatic function panel -     Lipase -     Celiac Disease Ab Screen w/Rfx  Non-intractable vomiting with nausea, unspecified vomiting type -     H Pylori, IGM, IGG, IGA AB -     H. pylori antigen, stool; Future -     famotidine (PEPCID) 20 MG tablet; Take 1 tablet (20 mg total) by mouth 2 (two) times daily. -     ondansetron (ZOFRAN ODT) 4 MG disintegrating tablet; Take 1 tablet (4 mg total) by mouth every 8 (eight) hours as needed. -     POCT urine pregnancy -     Hepatic function panel -     Lipase -     Celiac Disease Ab Screen w/Rfx   I am having Crystal Wagner maintain her Lo Loestrin Fe, famotidine, and ondansetron.  Meds ordered this encounter  Medications  . famotidine (PEPCID) 20 MG tablet    Sig: Take 1 tablet (20 mg total) by mouth 2 (two) times daily.    Dispense:  30 tablet    Refill:  5    Order Specific Question:   Supervising Provider    Answer:   Lucille Passy [[id number]]  . ondansetron (ZOFRAN ODT) 4 MG disintegrating tablet    Sig: Take 1 tablet (4 mg total) by mouth every 8 (eight) hours as needed.    Dispense:  20 tablet    Refill:  0    Order Specific Question:   Supervising Provider    Answer:   Lucille Passy [[id number]]    Problem List Items Addressed This Visit    None    Visit Diagnoses    Dyspepsia    -  Primary   Relevant Medications   famotidine (PEPCID) 20 MG tablet   ondansetron (ZOFRAN ODT) 4 MG disintegrating tablet   Other Relevant Orders   H Pylori, IGM, IGG, IGA AB   H. pylori  antigen, stool   POCT urine pregnancy   Hepatic function panel   Lipase   Celiac Disease Ab Screen w/Rfx   Non-intractable vomiting with nausea, unspecified vomiting type  Relevant Medications   famotidine (PEPCID) 20 MG tablet   ondansetron (ZOFRAN ODT) 4 MG disintegrating tablet   Other Relevant Orders   H Pylori, IGM, IGG, IGA AB   H. pylori antigen, stool   POCT urine pregnancy   Hepatic function panel   Lipase   Celiac Disease Ab Screen w/Rfx       Follow-up: Return if symptoms worsen or fail to improve.  Alysia Penna, NP

## 2018-12-13 ENCOUNTER — Encounter: Payer: Self-pay | Admitting: Nurse Practitioner

## 2018-12-13 DIAGNOSIS — R1013 Epigastric pain: Secondary | ICD-10-CM | POA: Insufficient documentation

## 2018-12-13 LAB — HEPATIC FUNCTION PANEL
ALT: 11 IU/L (ref 0–32)
AST: 10 IU/L (ref 0–40)
Albumin: 4.8 g/dL (ref 3.9–5.0)
Alkaline Phosphatase: 38 IU/L — ABNORMAL LOW (ref 39–117)
Bilirubin Total: 0.5 mg/dL (ref 0.0–1.2)
Bilirubin, Direct: 0.13 mg/dL (ref 0.00–0.40)
Total Protein: 6.9 g/dL (ref 6.0–8.5)

## 2018-12-13 LAB — CELIAC DISEASE AB SCREEN W/RFX
Antigliadin Abs, IgA: 2 units (ref 0–19)
IgA/Immunoglobulin A, Serum: 124 mg/dL (ref 87–352)
Transglutaminase IgA: <2 U/mL (ref 0–3)

## 2018-12-13 LAB — LIPASE: Lipase: 14 U/L (ref 14–72)

## 2018-12-13 NOTE — Assessment & Plan Note (Signed)
Onset 70month ago, symptoms (ABD pain, nausea, vomiting), no GU/vaginal symptoms, waxing and waning, some improvement with pepcid BID, normal ABD [place], normal liver enzymes, normal lipase, negative urine pregnancy, negative celiac panel, pending H. Pylori stool Resume pepcid BID

## 2018-12-14 LAB — H PYLORI, IGM, IGG, IGA AB
H pylori, IgM Abs: <9 units (ref 0.0–8.9)
H. pylori, IgA Abs: <9 units (ref 0.0–8.9)
H. pylori, IgG AbS: 7.9 Index Value — ABNORMAL HIGH (ref 0.00–0.79)

## 2018-12-15 ENCOUNTER — Encounter: Payer: Self-pay | Admitting: Nurse Practitioner

## 2018-12-15 ENCOUNTER — Ambulatory Visit (INDEPENDENT_AMBULATORY_CARE_PROVIDER_SITE_OTHER): Payer: BC Managed Care – PPO | Admitting: Nurse Practitioner

## 2018-12-15 ENCOUNTER — Other Ambulatory Visit: Payer: Self-pay

## 2018-12-15 VITALS — Temp 97.4°F | Ht 65.0 in

## 2018-12-15 DIAGNOSIS — R202 Paresthesia of skin: Secondary | ICD-10-CM | POA: Diagnosis not present

## 2018-12-15 DIAGNOSIS — B9681 Helicobacter pylori [H. pylori] as the cause of diseases classified elsewhere: Secondary | ICD-10-CM | POA: Diagnosis not present

## 2018-12-15 DIAGNOSIS — R112 Nausea with vomiting, unspecified: Secondary | ICD-10-CM

## 2018-12-15 DIAGNOSIS — K2970 Gastritis, unspecified, without bleeding: Secondary | ICD-10-CM

## 2018-12-15 NOTE — Patient Instructions (Addendum)
normal skin coloration and movement noted during video call. Unknown cause of symptoms at this time. Normal B12, folate, and iron panel. I recommend monitoring at this time. Call office if they worsen

## 2018-12-15 NOTE — Progress Notes (Addendum)
Virtual Visit via Video Note  I connected with Crystal Wagner on 12/19/18 at  4:00 PM EDT by a video enabled telemedicine application and verified that I am speaking with the correct person using two identifiers.  Location: Patient: private vehicle Provider: office   I discussed the limitations of evaluation and management by telemedicine and the availability of in person appointments. The patient expressed understanding and agreed to proceed.  CC: abnormal sensation in forearms.  History of Present Illness: Reports discoloration in fingers in Am and tingling sensation in both forearms since this morning. No focal weakness or swelling No neck pain or radicular pain   Observations/Objective: Physical Exam  Constitutional: She is oriented to person, place, and time. No distress.  Musculoskeletal:        General: No deformity or edema.     Right wrist: She exhibits normal range of motion, no swelling and no effusion.     Left wrist: She exhibits normal range of motion, no swelling and no effusion.     Right forearm: She exhibits no swelling and no edema.     Left forearm: She exhibits no swelling and no edema.     Right hand: She exhibits normal range of motion, normal capillary refill and no swelling.     Left hand: She exhibits normal range of motion, normal capillary refill and no swelling.  Neurological: She is alert and oriented to person, place, and time.  Skin: Skin is dry. No rash noted. No erythema. No pallor.   Assessment and Plan: Nasteho was seen today for numbness.  Diagnoses and all orders for this visit:  Paresthesia of hand, bilateral -     Iron, TIBC and Ferritin Panel -     B12 and Folate Panel  Helicobacter pylori gastritis -     H. pylori antigen, stool -     omeprazole (PRILOSEC) 20 MG capsule; Take 1 capsule (20 mg total) by mouth 2 (two) times daily before a meal for 14 days. -     clarithromycin (BIAXIN) 500 MG tablet; Take 1 tablet (500 mg total) by  mouth 2 (two) times daily for 14 days. -     amoxicillin (AMOXIL) 500 MG capsule; Take 1 capsule (500 mg total) by mouth 2 (two) times daily for 14 days.  Non-intractable vomiting with nausea, unspecified vomiting type -     H. pylori antigen, stool   Follow Up Instructions: normal skin coloration and movement noted during video call. Unknown cause of symptoms at this time. Normal B12, folate, and iron panel. I recommend monitoring at this time. Call office if they worsen   I discussed the assessment and treatment plan with the patient. The patient was provided an opportunity to ask questions and all were answered. The patient agreed with the plan and demonstrated an understanding of the instructions.   The patient was advised to call back or seek an in-person evaluation if the symptoms worsen or if the condition fails to improve as anticipated.  Wilfred Lacy, NP

## 2018-12-16 ENCOUNTER — Encounter: Payer: Self-pay | Admitting: Nurse Practitioner

## 2018-12-16 LAB — IRON,TIBC AND FERRITIN PANEL
%SAT: 21 % (calc) (ref 16–45)
Ferritin: 38 ng/mL (ref 16–154)
Iron: 86 ug/dL (ref 40–190)
TIBC: 408 mcg/dL (calc) (ref 250–450)

## 2018-12-16 LAB — B12 AND FOLATE PANEL
Folate: 14.9 ng/mL
Vitamin B-12: 363 pg/mL (ref 200–1100)

## 2018-12-17 LAB — H. PYLORI ANTIGEN, STOOL: H pylori Ag, Stl: POSITIVE — AB

## 2018-12-19 MED ORDER — OMEPRAZOLE 20 MG PO CPDR: 20.0000 mg | DELAYED_RELEASE_CAPSULE | Freq: Two times a day (BID) | ORAL | 0 refills | Status: DC

## 2018-12-19 MED ORDER — AMOXICILLIN 500 MG PO CAPS: 500.0000 mg | ORAL_CAPSULE | Freq: Two times a day (BID) | ORAL | 0 refills | Status: AC

## 2018-12-19 MED ORDER — CLARITHROMYCIN 500 MG PO TABS: 500.0000 mg | ORAL_TABLET | Freq: Two times a day (BID) | ORAL | 0 refills | Status: AC

## 2018-12-19 NOTE — Addendum Note (Signed)
Addended by: Wilfred Lacy L on: 12/19/2018 09:08 AM   Modules accepted: Orders

## 2018-12-30 ENCOUNTER — Encounter: Payer: BC Managed Care – PPO | Admitting: Obstetrics & Gynecology

## 2019-01-02 ENCOUNTER — Encounter: Payer: Self-pay | Admitting: Nurse Practitioner

## 2019-01-02 ENCOUNTER — Other Ambulatory Visit: Payer: Self-pay

## 2019-01-03 ENCOUNTER — Ambulatory Visit: Payer: BC Managed Care – PPO | Admitting: Nurse Practitioner

## 2019-01-03 ENCOUNTER — Encounter: Payer: Self-pay | Admitting: Nurse Practitioner

## 2019-01-03 ENCOUNTER — Encounter: Payer: BC Managed Care – PPO | Admitting: Obstetrics & Gynecology

## 2019-01-03 ENCOUNTER — Other Ambulatory Visit: Payer: Self-pay | Admitting: Obstetrics & Gynecology

## 2019-01-03 ENCOUNTER — Telehealth: Payer: Self-pay

## 2019-01-03 ENCOUNTER — Encounter: Payer: Self-pay | Admitting: Neurology

## 2019-01-03 VITALS — BP 110/70 | HR 88 | Temp 98.2°F | Ht 65.0 in | Wt 110.4 lb

## 2019-01-03 DIAGNOSIS — R202 Paresthesia of skin: Secondary | ICD-10-CM | POA: Insufficient documentation

## 2019-01-03 DIAGNOSIS — B9681 Helicobacter pylori [H. pylori] as the cause of diseases classified elsewhere: Secondary | ICD-10-CM | POA: Insufficient documentation

## 2019-01-03 DIAGNOSIS — Z23 Encounter for immunization: Secondary | ICD-10-CM

## 2019-01-03 DIAGNOSIS — K2970 Gastritis, unspecified, without bleeding: Secondary | ICD-10-CM | POA: Diagnosis not present

## 2019-01-03 NOTE — Telephone Encounter (Signed)
Annual scheduled on 01/05/19

## 2019-01-03 NOTE — Progress Notes (Signed)
Subjective:  Patient ID: Crystal Wagner, female    DOB: [birth year]  Age: 28 y.o. MRN: [id number]  CC: Follow-up (Numbness in both hands, feet and forearms occasionally. pt been having this sensation for about 3wks)  HPI  Ms. Crystal Wagner presents with persistent tingling in hands and feet x 3weeks, symptoms are waxing and waning, she is unable to identify any specific trigger. Symptoms are not affected by position or time of the day. No Fhx of neuromuscular or autoimmune disorder.  Reviewed past Medical, Social and Family history today.  Outpatient Medications Prior to Visit  Medication Sig Dispense Refill   LO LOESTRIN FE 1 MG-10 MCG / 10 MCG tablet TAKE 1 TABLET BY MOUTH EVERY DAY 84 tablet 0   ondansetron (ZOFRAN ODT) 4 MG disintegrating tablet Take 1 tablet (4 mg total) by mouth every 8 (eight) hours as needed. 20 tablet 0   famotidine (PEPCID) 20 MG tablet Take 1 tablet (20 mg total) by mouth 2 (two) times daily. (Patient not taking: Reported on 01/03/2019) 30 tablet 5   omeprazole (PRILOSEC) 20 MG capsule Take 1 capsule (20 mg total) by mouth 2 (two) times daily before a meal for 14 days. 28 capsule 0   No facility-administered medications prior to visit.     ROS See HPI  Objective:  BP 110/70    Pulse 88    Temp 98.2 F (36.8 C) (Tympanic)    Ht 5\' 5"  (1.651 m)    Wt 110 lb 6.4 oz (50.1 kg)    SpO2 100%    BMI 18.37 kg/m   BP Readings from Last 3 Encounters:  01/03/19 110/70  12/12/18 98/68  11/10/18 108/77    Wt Readings from Last 3 Encounters:  01/03/19 110 lb 6.4 oz (50.1 kg)  12/12/18 108 lb (49 kg)  11/21/18 106 lb 3.2 oz (48.2 kg)    Physical Exam Constitutional:      Appearance: She is normal weight.  HENT:     Head: Normocephalic.  Eyes:     Extraocular Movements: Extraocular movements intact.     Conjunctiva/sclera: Conjunctivae normal.  Cardiovascular:     Rate and Rhythm: Normal rate and regular rhythm.     Pulses: Normal pulses.     Heart  sounds: Normal heart sounds.  Pulmonary:     Effort: Pulmonary effort is normal.     Breath sounds: Normal breath sounds.  Musculoskeletal: Normal range of motion.        General: No swelling or tenderness.     Right lower leg: No edema.     Left lower leg: No edema.  Skin:    General: Skin is warm and dry.     Capillary Refill: Capillary refill takes less than 2 seconds.     Findings: No rash.  Neurological:     Mental Status: She is alert and oriented to person, place, and time.     Cranial Nerves: Cranial nerves are intact.     Sensory: Sensation is intact. No sensory deficit.     Motor: Motor function is intact.     Coordination: Coordination is intact.     Gait: Gait is intact.     Deep Tendon Reflexes: Babinski sign absent on the right side. Babinski sign absent on the left side.     Reflex Scores:      Bicep reflexes are 2+ on the right side and 2+ on the left side.      Patellar reflexes are 2+ on the  right side and 2+ on the left side. Psychiatric:        Mood and Affect: Mood normal.        Behavior: Behavior normal.        Thought Content: Thought content normal.     Lab Results  Component Value Date   WBC 5.7 11/21/2018   HGB 13.9 11/21/2018   HCT 41.9 11/21/2018   PLT 243.0 11/21/2018   GLUCOSE 112 (H) 11/21/2018   ALT 11 12/12/2018   AST 10 12/12/2018   NA 137 11/21/2018   K 3.7 11/21/2018   CL 102 11/21/2018   CREATININE 0.91 11/21/2018   BUN 13 11/21/2018   CO2 27 11/21/2018   US Abdomen Limited Ruq  Result Date: 11/23/2018 CLINICAL DATA:  28 year old female with abdominal pain and nausea for 2 weeks. EXAM: ULTRASOUND ABDOMEN LIMITED RIGHT UPPER QUADRANT COMPARISON:  None. FINDINGS: Gallbladder: No gallstones or wall thickening visualized. No sonographic Murphy sign noted by sonographer. Common bile duct: Diameter: 3 millimeters, normal. Liver: No focal lesion identified. Within normal limits in parenchymal echogenicity. Portal vein is patent on color  Doppler imaging with normal direction of blood flow towards the liver. Other: Negative visible pancreas, right kidney. IMPRESSION: Normal right upper quadrant ultrasound. Electronically Signed   By: Genevie Ann M.D.   On: 11/23/2018 10:49   Assessment & Plan:   Crystal Wagner was seen today for follow-up.  Diagnoses and all orders for this visit:  Tingling in extremities -     Helicobacter pylori special antigen; Future -     Ambulatory referral to Neurology  Helicobacter pylori gastritis -     Helicobacter pylori special antigen; Future  Other orders -     Flu Vaccine QUAD 6+ mos PF IM (Fluarix Quad PF)   I am having Crystal Wagner maintain her famotidine, ondansetron, omeprazole, and Lo Loestrin Fe.  No orders of the defined types were placed in this encounter.   Problem List Items Addressed This Visit      Digestive   Helicobacter pylori gastritis    Completed omeprazole, clarithromycin and amoxicillin. Symptoms have improved.  Ordered repeat stool antigen.      Relevant Orders   Helicobacter pylori special antigen     Other   Tingling in extremities - Primary    Onset 3weeks ago, worsening, intermittent No weakness. No atrophy. No change in vision. No ETOH or tobacco or illicit drug use.  Normal neuro exam today Labs completed: CBC, CMP, TSH, B12, folate, IBC (normal) Entered referral to neurology.      Relevant Orders   Helicobacter pylori special antigen   Ambulatory referral to Neurology       Follow-up: Return if symptoms worsen or fail to improve.  Wilfred Lacy, NP

## 2019-01-03 NOTE — Assessment & Plan Note (Signed)
Completed omeprazole, clarithromycin and amoxicillin. Symptoms have improved.  Ordered repeat stool antigen.

## 2019-01-03 NOTE — Patient Instructions (Addendum)
You will be contacted to schedule appt with neurology  Go to lab for stool kit. Return stool to lab as soon as possible.   Paresthesia Paresthesia is a burning or prickling feeling. This feeling can happen in any part of the body. It often happens in the hands, arms, legs, or feet. Usually, it is not painful. In most cases, the feeling goes away in a short time and is not a sign of a serious problem. If you have paresthesia that lasts a long time, you may need to be seen by your doctor. Follow these instructions at home: Alcohol use   Do not drink alcohol if: ? Your doctor tells you not to drink. ? You are pregnant, may be pregnant, or are planning to become pregnant.  If you drink alcohol: ? Limit how much you use to:  0-1 drink a day for women.  0-2 drinks a day for men. ? Be aware of how much alcohol is in your drink. In the [place] one drink equals one 12 oz bottle of beer (355 mL), one 5 oz glass of wine (148 mL), or one 1 oz glass of hard liquor (44 mL). Nutrition   Eat a healthy diet. This includes: ? Eating foods that have a lot of fiber in them, such as fresh fruits and vegetables, whole grains, and beans. ? Limiting foods that have a lot of fat and processed sugars in them, such as fried or sweet foods. General instructions  Take over-the-counter and prescription medicines only as told by your doctor.  Do not use any products that have nicotine or tobacco in them, such as cigarettes and e-cigarettes. If you need help quitting, ask your doctor.  If you have diabetes, work with your doctor to make sure your blood sugar stays in a healthy range.  If your feet feel numb: ? Check for redness, warmth, and swelling every day. ? Wear padded socks and comfortable shoes. These help protect your feet.  Keep all follow-up visits as told by your doctor. This is important. Contact a doctor if:  You have paresthesia that gets worse or does not go away.  Your burning or  prickling feeling gets worse when you walk.  You have pain or cramps.  You feel dizzy.  You have a rash. Get help right away if you:  Feel weak.  Have trouble walking or moving.  Have problems speaking, understanding, or seeing.  Feel confused.  Cannot control when you pee (urinate) or poop (have a bowel movement).  Lose feeling (have numbness) after an injury.  Have new weakness in an arm or leg.  Pass out (faint). Summary  Paresthesia is a burning or prickling feeling. It often happens in the hands, arms, legs, or feet.  In most cases, the feeling goes away in a short time and is not a sign of a serious problem.  If you have paresthesia that lasts a long time, you may need to be seen by your doctor. This information is not intended to replace advice given to you by your health care provider. Make sure you discuss any questions you have with your health care provider. Document Released: 02/27/2008 Document Revised: 04/11/2018 Document Reviewed: 03/25/2017 Elsevier Patient Education  2020 [employer].

## 2019-01-03 NOTE — Assessment & Plan Note (Signed)
Onset 3weeks ago, worsening, intermittent No weakness. No atrophy. No change in vision. No ETOH or tobacco or illicit drug use.  Normal neuro exam today Labs completed: CBC, CMP, TSH, B12, folate, IBC (normal) Entered referral to neurology.

## 2019-01-03 NOTE — Telephone Encounter (Signed)
Pt wanted to know was it okay to proceed w/ stool kit after finishing her meds. Pt was aware it was okay to proceed w/ stool kit per pcp.

## 2019-01-04 ENCOUNTER — Other Ambulatory Visit: Payer: Self-pay

## 2019-01-04 ENCOUNTER — Other Ambulatory Visit: Payer: BC Managed Care – PPO

## 2019-01-04 DIAGNOSIS — K2970 Gastritis, unspecified, without bleeding: Secondary | ICD-10-CM

## 2019-01-04 DIAGNOSIS — R202 Paresthesia of skin: Secondary | ICD-10-CM

## 2019-01-04 DIAGNOSIS — B9681 Helicobacter pylori [H. pylori] as the cause of diseases classified elsewhere: Secondary | ICD-10-CM

## 2019-01-05 ENCOUNTER — Ambulatory Visit: Payer: BC Managed Care – PPO | Admitting: Obstetrics & Gynecology

## 2019-01-05 ENCOUNTER — Encounter: Payer: Self-pay | Admitting: Obstetrics & Gynecology

## 2019-01-05 VITALS — BP 120/78 | Ht 65.0 in | Wt 109.0 lb

## 2019-01-05 DIAGNOSIS — R202 Paresthesia of skin: Secondary | ICD-10-CM | POA: Diagnosis not present

## 2019-01-05 DIAGNOSIS — R200 Anesthesia of skin: Secondary | ICD-10-CM

## 2019-01-05 DIAGNOSIS — Z01419 Encounter for gynecological examination (general) (routine) without abnormal findings: Secondary | ICD-10-CM | POA: Diagnosis not present

## 2019-01-05 DIAGNOSIS — Z3041 Encounter for surveillance of contraceptive pills: Secondary | ICD-10-CM | POA: Diagnosis not present

## 2019-01-05 MED ORDER — LO LOESTRIN FE 1 MG-10 MCG / 10 MCG PO TABS: 1.0000 | ORAL_TABLET | Freq: Every day | ORAL | 4 refills | Status: DC

## 2019-01-05 NOTE — Patient Instructions (Signed)
1. Encounter for routine gynecological examination with Papanicolaou smear of cervix Normal gynecologic exam.  Pap reflex done.  Breast exam normal.  Body mass index at the lower limits of normal at 18.14.  Weight loss due to nausea and decreased appetite in the past 2 months.  Patient is under investigation for nausea, stomach discomfort and numbing with tingling in the upper and lower extremities.  An appointment with a neurologist is scheduled early November 2020.  2. Encounter for surveillance of contraceptive pills Would like to change to a nonhormonal contraception, but will wait until her neurologic investigation is completed.  Will probably try ParaGard IUD.  At this time will continue on low Loestrin FE 1/10.  No contraindications continue.  Prescription sent to pharmacy.  3. Numbness and tingling of left upper and lower extremity Referred to neurology early November 2020.  Other orders - Norethindrone-Ethinyl Estradiol-Fe Biphas (LO LOESTRIN FE) 1 MG-10 MCG / 10 MCG tablet; Take 1 tablet by mouth daily.  Crystal Wagner, it was a pleasure seeing you today!  I will inform you of your results as soon as they are available.

## 2019-01-05 NOTE — Progress Notes (Signed)
Crystal Wagner 08-12-1990 [id number]   History:    28 y.o.  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  No breakthrough bleeding.  No pelvic pain.  No pain with intercourse.  Under investigation for stomach discomfort with nausea and neurologic symptoms.  Patient is experiencing intermittent tingling and numbing of the upper and lower extremities.  And neurology appointment is scheduled early November 2020.  Urine and bowel movements normal.  Breasts normal.  Body mass index decreased to 18.14 with weight loss in the last 2 months due to nausea and decreased appetite.  Health labs with family NP.  Past medical history,surgical history, family history and social history were all reviewed and documented in the EPIC chart.  Gynecologic History Patient's last menstrual period was 12/16/2018. Contraception: OCP (estrogen/progesterone) Last Pap: 10/2017. Results were: Negative Last mammogram: Never Bone Density: Never Colonoscopy: Never  Obstetric History OB History  Gravida Para Term Preterm AB Living  0 0 0 0 0 0  SAB TAB Ectopic Multiple Live Births  0 0 0 0 0     ROS: A ROS was performed and pertinent positives and negatives are included in the history.  GENERAL: No fevers or chills. HEENT: No change in vision, no earache, sore throat or sinus congestion. NECK: No pain or stiffness. CARDIOVASCULAR: No chest pain or pressure. No palpitations. PULMONARY: No shortness of breath, cough or wheeze. GASTROINTESTINAL: No abdominal pain, nausea, vomiting or diarrhea, melena or bright red blood per rectum. GENITOURINARY: No urinary frequency, urgency, hesitancy or dysuria. MUSCULOSKELETAL: No joint or muscle pain, no back pain, no recent trauma. DERMATOLOGIC: No rash, no itching, no lesions. ENDOCRINE: No polyuria, polydipsia, no heat or cold intolerance. No recent change in weight. HEMATOLOGICAL: No anemia or easy  bruising or bleeding. NEUROLOGIC: No headache, seizures, numbness, tingling or weakness. PSYCHIATRIC: No depression, no loss of interest in normal activity or change in sleep pattern.     Exam:   BP 120/78 (BP Location: Right Arm, Patient Position: Sitting, Cuff Size: Normal)   Ht 5\' 5"  (1.651 m)   Wt 109 lb (49.4 kg)   LMP 12/16/2018   BMI 18.14 kg/m   Body mass index is 18.14 kg/m.  General appearance : Well developed well nourished female. No acute distress HEENT: Eyes: no retinal hemorrhage or exudates,  Neck supple, trachea midline, no carotid bruits, no thyroidmegaly Lungs: Clear to auscultation, no rhonchi or wheezes, or rib retractions  Heart: Regular rate and rhythm, no murmurs or gallops Breast:Examined in sitting and supine position were symmetrical in appearance, no palpable masses or tenderness,  no skin retraction, no nipple inversion, no nipple discharge, no skin discoloration, no axillary or supraclavicular lymphadenopathy Abdomen: no palpable masses or tenderness, no rebound or guarding Extremities: no edema or skin discoloration or tenderness  Pelvic: Vulva: Normal             Vagina: No gross lesions or discharge  Cervix: No gross lesions or discharge.  Pap reflex done  Uterus  AV, normal size, shape and consistency, non-tender and mobile  Adnexa  Without masses or tenderness  Anus: Normal   Assessment/Plan:  28 y.o. female for annual exam   1. Encounter for routine gynecological examination with Papanicolaou smear of cervix Normal gynecologic exam.  Pap reflex done.  Breast exam normal.  Body mass index at the lower limits of normal at 18.14.  Weight loss due  to nausea and decreased appetite in the past 2 months.  Patient is under investigation for nausea, stomach discomfort and numbing with tingling in the upper and lower extremities.  An appointment with a neurologist is scheduled early November 2020.  2. Encounter for surveillance of contraceptive pills  Would like to change to a nonhormonal contraception, but will wait until her neurologic investigation is completed.  Will probably try ParaGard IUD.  At this time will continue on low Loestrin FE 1/10.  No contraindications continue.  Prescription sent to pharmacy.  3. Numbness and tingling of left upper and lower extremity Referred to neurology early November 2020.  Other orders - Norethindrone-Ethinyl Estradiol-Fe Biphas (LO LOESTRIN FE) 1 MG-10 MCG / 10 MCG tablet; Take 1 tablet by mouth daily.  Genia Del MD, 4:03 PM 01/05/2019

## 2019-01-06 ENCOUNTER — Encounter: Payer: Self-pay | Admitting: Nurse Practitioner

## 2019-01-06 DIAGNOSIS — R1013 Epigastric pain: Secondary | ICD-10-CM

## 2019-01-06 LAB — PAP IG W/ RFLX HPV ASCU

## 2019-01-06 LAB — HELICOBACTER PYLORI  SPECIAL ANTIGEN
MICRO NUMBER:: 969363
SPECIMEN QUALITY: ADEQUATE

## 2019-01-16 ENCOUNTER — Encounter: Payer: Self-pay | Admitting: Gastroenterology

## 2019-01-30 ENCOUNTER — Other Ambulatory Visit (INDEPENDENT_AMBULATORY_CARE_PROVIDER_SITE_OTHER): Payer: BC Managed Care – PPO

## 2019-01-30 ENCOUNTER — Encounter: Payer: Self-pay | Admitting: Neurology

## 2019-01-30 ENCOUNTER — Other Ambulatory Visit: Payer: Self-pay

## 2019-01-30 ENCOUNTER — Ambulatory Visit: Payer: BC Managed Care – PPO | Admitting: Neurology

## 2019-01-30 VITALS — BP 108/60 | HR 70 | Ht 65.0 in | Wt 111.0 lb

## 2019-01-30 DIAGNOSIS — R202 Paresthesia of skin: Secondary | ICD-10-CM | POA: Diagnosis not present

## 2019-01-30 NOTE — Patient Instructions (Addendum)
Nerve testing of the right arm and leg  Check labs Your provider has requested that you have labwork completed today. Please go to [facility] ([id number]) on the second floor of this building before leaving the office today. You do not need to check in. If you are not called within 15 minutes please check with the front desk.  ELECTROMYOGRAM AND NERVE CONDUCTION STUDIES (EMG/NCS) INSTRUCTIONS  How to Prepare The neurologist conducting the EMG will need to know if you have certain medical conditions. Tell the neurologist and other EMG lab personnel if you: . Have a pacemaker or any other electrical medical device . Take blood-thinning medications . Have hemophilia, a blood-clotting disorder that causes prolonged bleeding Bathing Take a shower or bath shortly before your exam in order to remove oils from your skin. Don't apply lotions or creams before the exam.  What to Expect You'll likely be asked to change into a hospital gown for the procedure and lie down on an examination table. The following explanations can help you understand what will happen during the exam.  . Electrodes. The neurologist or a technician places surface electrodes at various locations on your skin depending on where you're experiencing symptoms. Or the neurologist may insert needle electrodes at different sites depending on your symptoms.  . Sensations. The electrodes will at times transmit a tiny electrical current that you may feel as a twinge or spasm. The needle electrode may cause discomfort or pain that usually ends shortly after the needle is removed. If you are concerned about discomfort or pain, you may want to talk to the neurologist about taking a short break during the exam.  . Instructions. During the needle EMG, the neurologist will assess whether there is any spontaneous electrical activity when the muscle is at rest - activity that isn't present in healthy muscle tissue - and the degree of activity  when you slightly contract the muscle.  He or she will give you instructions on resting and contracting a muscle at appropriate times. Depending on what muscles and nerves the neurologist is examining, he or she may ask you to change positions during the exam.  After your EMG You may experience some temporary, minor bruising where the needle electrode was inserted into your muscle. This bruising should fade within several days. If it persists, contact your primary care doctor.

## 2019-01-30 NOTE — Progress Notes (Signed)
[facility] Clinic Note - Initial Visit   Date: 01/30/19  Stormey Wilborn MRN: [id number] DOB: 08-02-1990   Dear Wilfred Lacy, NP:  Thank you for your kind referral of Quita Skye for consultation of paresthesias. Although her history is well known to you, please allow [place] to reiterate it for the purpose of our medical record. The patient was accompanied to the clinic by self.   History of Present Illness: Crystal Wagner is a 28 y.o. right-handed female presenting for evaluation of generalized numbness/tingling. Starting in September 2020, she noticed tingling in the fingers and into the forearms.  A few days later, she noticed it in the feet, back of the calf, and then entirety of the legs up to the hips.  Symptoms are intermittent and vary in location.  It occurs about 2-3 times per, lasting anywhere from 10-minutes to several hours.  Tingling does not involve her chest, abdomen, or face.  She has not identified any triggers or pattern.    She has been treated for H. Pylori since August and will be seeing GI next week due to ongoing symptoms.  She was given amoxicillin, clarithromycin, pepcid, and omeprazole.  Her symptoms of tingling started prior to taking any of these medications.    She works as a [employer].  She endorses being mildly stressed, but does not feel that this is anymore than usual.  Out-side paper records, electronic medical record, and images have been reviewed where available and summarized as:  Lab Results  Component Value Date   [id number] 363 12/15/2018    Past Medical History:  Diagnosis Date  . Positive serology for Helicobacter pylori     Past Surgical History:  Procedure Laterality Date  . WISDOM TOOTH EXTRACTION      Medications:  Outpatient Encounter Medications as of 01/30/2019  Medication Sig  . famotidine (PEPCID) 10 MG tablet Take 10 mg by mouth 2 (two) times daily.  . Norethindrone-Ethinyl  Estradiol-Fe Biphas (LO LOESTRIN FE) 1 MG-10 MCG / 10 MCG tablet Take 1 tablet by mouth daily.  . ondansetron (ZOFRAN ODT) 4 MG disintegrating tablet Take 1 tablet (4 mg total) by mouth every 8 (eight) hours as needed.   No facility-administered encounter medications on file as of 01/30/2019.     Allergies: No Known Allergies  Family History: Family History  Problem Relation Age of Onset  . Diabetes Maternal Aunt   . Kidney disease Maternal Aunt   . Hypertension Maternal Aunt     Social History: Social History   Tobacco Use  . Smoking status: Never Smoker  . Smokeless tobacco: Never Used  Substance Use Topics  . Alcohol use: Yes    Comment: occ  . Drug use: Never   Social History   Social History Narrative   Right handed   No children   One story home       Review of Systems:  CONSTITUTIONAL: No fevers, chills, night sweats, or weight loss.  +hot flashes EYES: No visual changes or eye pain ENT: No hearing changes.  No history of nose bleeds.   RESPIRATORY: No cough, wheezing and shortness of breath.   CARDIOVASCULAR: Negative for chest pain, and palpitations.   GI: +for abdominal discomfort, blood in stools or black stools.  No recent change in bowel habits.   GU:  No history of incontinence.   MUSCLOSKELETAL: No history of joint pain or swelling.  No myalgias.   SKIN: Negative for lesions, rash, and itching.  HEMATOLOGY/ONCOLOGY: Negative for prolonged bleeding, bruising easily, and swollen nodes.  No history of cancer.   ENDOCRINE: Negative for cold or heat intolerance, polydipsia or goiter.   PSYCH:  No depression or anxiety symptoms.   NEURO: As Above.   Vital Signs:  BP 108/60   Pulse 70   Ht 5\' 5"  (1.651 m)   Wt 111 lb (50.3 kg)   SpO2 98%   BMI 18.47 kg/m    General Medical Exam:   General:  Well appearing, comfortable.   Eyes/ENT: see cranial nerve examination.   Neck:   No carotid bruits. Respiratory:  Clear to auscultation, good air entry  bilaterally.   Cardiac:  Regular rate and rhythm, no murmur.   Extremities:  No deformities, edema, or skin discoloration.  Skin:  No rashes or lesions.  Neurological Exam: MENTAL STATUS including orientation to time, place, person, recent and remote memory, attention span and concentration, language, and fund of knowledge is normal.  Speech is not dysarthric.  CRANIAL NERVES: II:  No visual field defects.     III-IV-VI: Pupils equal round and reactive to light.  Normal conjugate, extra-ocular eye movements in all directions of gaze.  No nystagmus.  No ptosis.   V:  Normal facial sensation.    VII:  Normal facial symmetry and movements.   VIII:  Normal hearing and vestibular function.   IX-X:  Normal palatal movement.   XI:  Normal shoulder shrug and head rotation.   XII:  Normal tongue strength and range of motion, no deviation or fasciculation.  MOTOR:  No atrophy, fasciculations or abnormal movements.  No pronator drift.   Upper Extremity:  Right  Left  Deltoid  5/5   5/5   Biceps  5/5   5/5   Triceps  5/5   5/5   Infraspinatus 5/5  5/5  Medial pectoralis 5/5  5/5  Wrist extensors  5/5   5/5   Wrist flexors  5/5   5/5   Finger extensors  5/5   5/5   Finger flexors  5/5   5/5   Dorsal interossei  5/5   5/5   Abductor pollicis  5/5   5/5   Tone (Ashworth scale)  0  0   Lower Extremity:  Right  Left  Hip flexors  5/5   5/5   Hip extensors  5/5   5/5   Adductor 5/5  5/5  Abductor 5/5  5/5  Knee flexors  5/5   5/5   Knee extensors  5/5   5/5   Dorsiflexors  5/5   5/5   Plantarflexors  5/5   5/5   Toe extensors  5/5   5/5   Toe flexors  5/5   5/5   Tone (Ashworth scale)  0  0   MSRs:  Right        Left                  brachioradialis 2+  2+  biceps 2+  2+  triceps 2+  2+  patellar 2+  2+  ankle jerk 2+  2+  Hoffman no  no  plantar response down  down   SENSORY:  Normal and symmetric perception of light touch, pinprick, vibration, and proprioception.  Romberg's  sign absent.   COORDINATION/GAIT: Normal finger-to- nose-finger and heel-to-shin.  Intact rapid alternating movements bilaterally.  Able to rise from a chair without using arms.  Gait narrow based and stable. Tandem and stressed  gait intact.    IMPRESSION: Migratory paresthesias of the arms and legs.  Symptoms do not conform to a cutaneous nerve or dermatomal distribution and in the setting of normal neurologic exam, doubt this is caused by any worrisome neurological pathology.  Reassurance was provided.  To be complete, she will undergo NCS/EMG and screening for nutritional/metabolic conditions.  PLAN/RECOMMENDATIONS:  Check MMA, vitamin B1, TSH NCS/EMG of the right arm and leg If testing returns normal, stress reaction is likely   Thank you for allowing me to participate in patient's care.  If I can answer any additional questions, I would be pleased to do so.    Sincerely,    Latrel Szymczak K. Allena KatzPatel, DO

## 2019-02-01 ENCOUNTER — Other Ambulatory Visit: Payer: Self-pay

## 2019-02-01 DIAGNOSIS — R202 Paresthesia of skin: Secondary | ICD-10-CM

## 2019-02-02 LAB — METHYLMALONIC ACID, SERUM: Methylmalonic Acid, Quant: 138 nmol/L (ref 87–318)

## 2019-02-02 LAB — VITAMIN B1: Vitamin B1 (Thiamine): 18 nmol/L (ref 8–30)

## 2019-02-02 LAB — TSH: TSH: 2.6 mIU/L

## 2019-02-07 ENCOUNTER — Other Ambulatory Visit: Payer: Self-pay

## 2019-02-07 ENCOUNTER — Ambulatory Visit
Admission: RE | Admit: 2019-02-07 | Discharge: 2019-02-07 | Disposition: A | Discharge status: Home / Self Care | Payer: BC Managed Care – PPO | Source: Ambulatory Visit | Attending: Neurology | Admitting: Neurology

## 2019-02-07 DIAGNOSIS — R202 Paresthesia of skin: Secondary | ICD-10-CM

## 2019-02-07 MED ORDER — GADOBENATE DIMEGLUMINE 529 MG/ML IV SOLN
10.0000 mL | Freq: Once | INTRAVENOUS | Status: AC | PRN
  Administered 2019-02-07: 10 mL via INTRAVENOUS

## 2019-02-08 ENCOUNTER — Telehealth: Payer: Self-pay

## 2019-02-08 NOTE — Telephone Encounter (Signed)
Patient notified of results of Mri ,verbally understood

## 2019-02-09 ENCOUNTER — Encounter: Payer: Self-pay | Admitting: Gastroenterology

## 2019-02-09 ENCOUNTER — Other Ambulatory Visit: Payer: Self-pay

## 2019-02-09 ENCOUNTER — Ambulatory Visit (INDEPENDENT_AMBULATORY_CARE_PROVIDER_SITE_OTHER): Payer: BC Managed Care – PPO | Admitting: Gastroenterology

## 2019-02-09 VITALS — BP 94/56 | HR 76 | Temp 97.8°F | Ht 65.0 in | Wt 111.4 lb

## 2019-02-09 DIAGNOSIS — Z1159 Encounter for screening for other viral diseases: Secondary | ICD-10-CM

## 2019-02-09 DIAGNOSIS — A048 Other specified bacterial intestinal infections: Secondary | ICD-10-CM

## 2019-02-09 DIAGNOSIS — R1013 Epigastric pain: Secondary | ICD-10-CM | POA: Diagnosis not present

## 2019-02-09 NOTE — Telephone Encounter (Signed)
I believe this was sent in error to me.

## 2019-02-09 NOTE — Progress Notes (Signed)
Referring Provider: Anne NgNche, Charlotte Lum, NP Primary Care Physician:  Anne NgNche, Charlotte Lum, NP  Reason for Consultation:  Dyspepsia   IMPRESSION:  Dyspepsia     -Normal liver enzymes and lipase    -Celiac disease antibody panel negative    -Normal abdominal ultrasound H pylori stool antigen + 01/04/2019- finished treatment 6 weeks ago    -Treated with clarithromycin, amoxicillin, and omeprazole Iron deficiency without overt bleeding  Ongoing symptoms unexplained by ultrasound, liver enzymes, pancreatic enzymes, or celiac panel.  There was some temporal association while taking antibiotics for a positive H. pylori antigen test.  She completed triple therapy 6 weeks ago.  Differential includes persistent H. pylori, peptic ulcer disease, gastroesophageal reflux, carbohydrate malabsorption,  bacterial overgrowth, and infections such as Giardia. Differential also includes functional dyspepsia.  She does not take any medications commonly associated with dyspepsia except for oral contraceptives.  She wonders if this is a manifestation of underlying stress.  Her strong reaction to sugar suggest the possibility of a sucrose deficiency however I do not have a good explanation for why this would develop so acutely.  Given this differential I recommended an upper endoscopy with gastric biopsies.  We discussed alternatives including repeat H. pylori antibody testing.  She is interested in proceeding with endoscopic evaluation to exclude concurrent problems.  PLAN: Continue famotidine 10 mg twice a day I did not make any medication changes today EGD with gastric biopsies  The nature of the procedure, as well as the risks, benefits, and alternatives were carefully and thoroughly reviewed with the patient. Ample time for discussion and questions allowed. The patient understood, was satisfied, and agreed to proceed.  Please see the "Patient Instructions" section for addition details about the plan.   HPI: Crystal HanlyKourtney Magadan is a 28 y.o. female referred by Dr. Elease EtienneNche for further evaluation reflux and nausea after H pylori treatment.  The history is obtained through the patient and review of her electronic health record. Under evaluation by a neurologist for numbness and tingling that the patient thinks may be due to stress. 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   Early August thought she had a virus. Three weeks later, diagnosed with H pylori.  She felt better while on treatment with clarithromycin, amoxicillin, and omeprazole.  However, symptoms recurred 2 weeks after completing treatment.    Reports intermittent colicky, generalized, nonradiating abdominal pain.   Associated anorexia, nausea, vomiting. Symptoms triggered by greasy and fatty foods.   Lost 6-7 pounds in August. Has regained 4 of those.   Not tolerating sugar due to nausea, vomiting, and diarrhea 2-3 times.  Was able to eat sugar without incident prior to August.  Pepcid and IBGard provide some relief. Symptoms now only occurring every 2-3 weeks.     No NSAIDs. No other associated symptoms. No identified exacerbating or relieving features.   Normal RUQ ultrasound 11/23/18.  Recent labs include:   H pylori stool antigen positive. Normal liver enzymes, lipase, and negative celiac disease antibody panel. B12 normal 363, iron 86, ferritin 38, percent saturation 21  No known family history of colon cancer or polyps. No family history of uterine/endometrial cancer, pancreatic cancer or gastric/stomach cancer.   Past Medical History:  Diagnosis Date  . Positive serology for Helicobacter pylori     Past Surgical History:  Procedure Laterality Date  . WISDOM TOOTH EXTRACTION      Current Outpatient Medications  Medication Sig Dispense Refill  . famotidine (PEPCID)  10 MG tablet Take 10 mg by mouth 2 (two) times daily.    . Norethindrone-Ethinyl Estradiol-Fe Biphas (LO LOESTRIN FE) 1  MG-10 MCG / 10 MCG tablet Take 1 tablet by mouth daily. 84 tablet 4  . ondansetron (ZOFRAN ODT) 4 MG disintegrating tablet Take 1 tablet (4 mg total) by mouth every 8 (eight) hours as needed. 20 tablet 0  . Peppermint Oil (IBGARD) 90 MG CPCR Take 1 tablet by mouth daily.     No current facility-administered medications for this visit.     Allergies as of 02/09/2019  . (No Known Allergies)    Family History  Problem Relation Age of Onset  . Diabetes Maternal Aunt   . Kidney disease Maternal Aunt   . Hypertension Maternal Aunt   . Colon cancer Neg Hx   . Esophageal cancer Neg Hx   . Stomach cancer Neg Hx   . Pancreatic cancer Neg Hx   . Liver disease Neg Hx     Social History   Socioeconomic History  . Marital status: Married    Spouse name: Not on file  . Number of children: 0  . Years of education: 51  . Highest education level: Not on file  Occupational History  . Occupation: Runner, broadcasting/film/video  Social Needs  . Financial resource strain: Not on file  . Food insecurity    Worry: Not on file    Inability: Not on file  . Transportation needs    Medical: Not on file    Non-medical: Not on file  Tobacco Use  . Smoking status: Never Smoker  . Smokeless tobacco: Never Used  Substance and Sexual Activity  . Alcohol use: Yes    Comment: occ  . Drug use: Never  . Sexual activity: Yes    Partners: Male    Birth control/protection: Pill, None    Comment: 1st intercourse- 58, partners- 2, married- 10 yrs   Lifestyle  . Physical activity    Days per week: Not on file    Minutes per session: Not on file  . Stress: Not on file  Relationships  . Social Musician on phone: Not on file    Gets together: Not on file    Attends religious service: Not on file    Active member of club or organization: Not on file    Attends meetings of clubs or organizations: Not on file    Relationship status: Not on file  . Intimate partner violence    Fear of current or ex partner: Not  on file    Emotionally abused: Not on file    Physically abused: Not on file    Forced sexual activity: Not on file  Other Topics Concern  . Not on file  Social History Narrative   Right handed   No children   One story home       Review of Systems: 12 system ROS is negative except as noted above except for anxiety and stress.   Physical Exam: General:   Alert,  well-nourished, pleasant and cooperative in NAD Head:  Normocephalic and atraumatic. Eyes:  Sclera clear, no icterus.   Conjunctiva pink. Ears:  Normal auditory acuity. Nose:  No deformity, discharge,  or lesions. Mouth:  No deformity or lesions.   Neck:  Supple; no masses or thyromegaly. Lungs:  Clear throughout to auscultation.   No wheezes. Heart:  Regular rate and rhythm; no murmurs. Abdomen:  Soft,nontender, nondistended, normal bowel sounds, no rebound  or guarding. No hepatosplenomegaly.   Rectal:  Deferred  Msk:  Symmetrical. No boney deformities LAD: No inguinal or umbilical LAD Extremities:  No clubbing or edema. Neurologic:  Alert and  oriented x4;  grossly nonfocal Skin:  Intact without significant lesions or rashes. Psych:  Alert and cooperative. Normal mood and affect.   Studies/Results: Mr Jeri Cos Wo Contrast  Result Date: 02/07/2019 CLINICAL DATA:  Intermittent tingling arms and legs EXAM: MRI HEAD WITHOUT AND WITH CONTRAST TECHNIQUE: Multiplanar, multiecho pulse sequences of the brain and surrounding structures were obtained without and with intravenous contrast. CONTRAST:  35mL MULTIHANCE GADOBENATE DIMEGLUMINE 529 MG/ML IV SOLN COMPARISON:  None. FINDINGS: Brain: No acute infarction, hemorrhage, hydrocephalus, extra-axial collection or mass lesion. Normal white matter. No evidence of demyelinating disease. Vascular: Normal arterial flow voids. Skull and upper cervical spine: Negative Sinuses/Orbits: Negative Other: None IMPRESSION: Normal MRI head Electronically Signed   By: Franchot Gallo M.D.   On:  02/07/2019 19:10      Celesta Funderburk L. Tarri Glenn, MD, MPH 02/09/2019, 11:13 AM

## 2019-02-09 NOTE — Patient Instructions (Signed)
You have been scheduled for an endoscopy. Please follow written instructions given to you at your visit today. If you use inhalers (even only as needed), please bring them with you on the day of your procedure.   

## 2019-02-13 ENCOUNTER — Encounter: Payer: Self-pay | Admitting: Gastroenterology

## 2019-02-15 ENCOUNTER — Other Ambulatory Visit: Payer: Self-pay

## 2019-02-15 DIAGNOSIS — F419 Anxiety disorder, unspecified: Secondary | ICD-10-CM

## 2019-02-20 ENCOUNTER — Ambulatory Visit: Payer: BC Managed Care – PPO

## 2019-02-20 ENCOUNTER — Other Ambulatory Visit: Payer: Self-pay | Admitting: Gastroenterology

## 2019-02-20 DIAGNOSIS — Z1159 Encounter for screening for other viral diseases: Secondary | ICD-10-CM

## 2019-02-20 LAB — SARS CORONAVIRUS 2 (TAT 6-24 HRS): SARS Coronavirus 2: NEGATIVE

## 2019-02-21 ENCOUNTER — Encounter: Payer: BC Managed Care – PPO | Admitting: Neurology

## 2019-02-22 ENCOUNTER — Encounter: Payer: Self-pay | Admitting: Gastroenterology

## 2019-02-22 ENCOUNTER — Ambulatory Visit (AMBULATORY_SURGERY_CENTER): Payer: BC Managed Care – PPO | Admitting: Gastroenterology

## 2019-02-22 ENCOUNTER — Other Ambulatory Visit: Payer: Self-pay

## 2019-02-22 VITALS — BP 104/69 | HR 69 | Temp 99.0°F | Resp 20 | Ht 65.0 in | Wt 111.0 lb

## 2019-02-22 DIAGNOSIS — B9681 Helicobacter pylori [H. pylori] as the cause of diseases classified elsewhere: Secondary | ICD-10-CM

## 2019-02-22 DIAGNOSIS — R1013 Epigastric pain: Secondary | ICD-10-CM

## 2019-02-22 DIAGNOSIS — K219 Gastro-esophageal reflux disease without esophagitis: Secondary | ICD-10-CM

## 2019-02-22 DIAGNOSIS — Z8619 Personal history of other infectious and parasitic diseases: Secondary | ICD-10-CM

## 2019-02-22 DIAGNOSIS — K3189 Other diseases of stomach and duodenum: Secondary | ICD-10-CM

## 2019-02-22 DIAGNOSIS — K2950 Unspecified chronic gastritis without bleeding: Secondary | ICD-10-CM | POA: Diagnosis not present

## 2019-02-22 MED ORDER — PANTOPRAZOLE SODIUM 40 MG PO TBEC: 40.0000 mg | DELAYED_RELEASE_TABLET | Freq: Every day | ORAL | 1 refills | Status: DC

## 2019-02-22 MED ORDER — SODIUM CHLORIDE 0.9 % IV SOLN: 500.0000 mL | Freq: Once | INTRAVENOUS | Status: DC

## 2019-02-22 NOTE — Progress Notes (Signed)
VS-KA Temperature- LC

## 2019-02-22 NOTE — Progress Notes (Signed)
Called to room to assist during endoscopic procedure.  Patient ID and intended procedure confirmed with present staff. Received instructions for my participation in the procedure from the performing physician.  

## 2019-02-22 NOTE — Op Note (Signed)
[facility] Patient Name: Crystal Wagner Procedure Date: 02/22/2019 10:04 AM MRN: [id number] Endoscopist: Thornton Park MD, MD Age: 28 Referring MD:  Date of Birth: 1991/01/17 Gender: Female Account #: [id number] Procedure:                Upper GI endoscopy Indications:              Dyspepsia                           -Normal liver enzymes and lipase                           -Celiac disease antibody panel negative                           -Normal abdominal ultrasound                           H pylori stool antigen + 01/04/2019- finished                            treatment 6 weeks ago                           -Treated with clarithromycin, amoxicillin, and                            omeprazole                           Iron deficiency without overt bleeding Medicines:                Monitored Anesthesia Care Procedure:                Pre-Anesthesia Assessment:                           - Prior to the procedure, a History and Physical                            was performed, and patient medications and                            allergies were reviewed. The patient's tolerance of                            previous anesthesia was also reviewed. The risks                            and benefits of the procedure and the sedation                            options and risks were discussed with the patient.                            All questions were answered, and informed consent  was obtained. Prior Anticoagulants: The patient has                            taken no previous anticoagulant or antiplatelet                            agents. ASA Grade Assessment: I - A normal, healthy                            patient. After reviewing the risks and benefits,                            the patient was deemed in satisfactory condition to                            undergo the procedure.                           After obtaining informed  consent, the endoscope was                            passed under direct vision. Throughout the                            procedure, the patient's blood pressure, pulse, and                            oxygen saturations were monitored continuously. The                            Endoscope was introduced through the mouth, and                            advanced to the third part of duodenum. The upper                            GI endoscopy was accomplished without difficulty.                            The patient tolerated the procedure well. Scope In: Scope Out: Findings:                 The examined esophagus was normal. Biopsies were                            obtained from the proximal and distal esophagus                            with cold forceps for histology of suspected                            eosinophilic esophagitis.                           Diffuse minimal inflammation characterized  by                            erythema and granularity was found in the gastric                            body. Biopsies were taken from the antrum, body,                            and fundus with a cold forceps for histology.                            Estimated blood loss was minimal.                           The examined duodenum was normal.                           The cardia and gastric fundus were normal on                            retroflexion.                           The exam was otherwise without abnormality. Complications:            No immediate complications. Estimated blood loss:                            Minimal. Estimated Blood Loss:     Estimated blood loss was minimal. Impression:               - Normal esophagus. Biopsied.                           - Gastritis. Biopsied.                           - Normal examined duodenum.                           - The examination was otherwise normal. Recommendation:           - Patient has a contact number available for                             emergencies. The signs and symptoms of potential                            delayed complications were discussed with the                            patient. Return to normal activities tomorrow.                            Written discharge instructions were provided to the  patient.                           - Resume previous diet today.                           - Continue present medications. Continue famotidine                            10 mg twice daily.                           - Start pantoprazole 40 mg daily taken 30-60                            minutes prior to breakfast x 8 weeks.                           - Avoid all NSAIDs.                           - Await pathology results.                           - Follow-up in the GI clinic in 6-8 weeks, earlier                            as needed. Tressia Danas MD, MD 02/22/2019 10:22:01 AM This report has been signed electronically.

## 2019-02-22 NOTE — Patient Instructions (Signed)
Discharge instructions given. Biopsies taken. Prescription sent to pharmacy. Resume previous medications. Avoid all NSAIDS. Office will schedule follow up visit. YOU HAD AN ENDOSCOPIC PROCEDURE TODAY AT [facility]:   Refer to the procedure report that was given to you for any specific questions about what was found during the examination.  If the procedure report does not answer your questions, please call your gastroenterologist to clarify.  If you requested that your care partner not be given the details of your procedure findings, then the procedure report has been included in a sealed envelope for you to review at your convenience later.  YOU SHOULD EXPECT: Some feelings of bloating in the abdomen. Passage of more gas than usual.  Walking can help get rid of the air that was put into your GI tract during the procedure and reduce the bloating. If you had a lower endoscopy (such as a colonoscopy or flexible sigmoidoscopy) you may notice spotting of blood in your stool or on the toilet paper. If you underwent a bowel prep for your procedure, you may not have a normal bowel movement for a few days.  Please Note:  You might notice some irritation and congestion in your nose or some drainage.  This is from the oxygen used during your procedure.  There is no need for concern and it should clear up in a day or so.  SYMPTOMS TO REPORT IMMEDIATELY:    Following upper endoscopy (EGD)  Vomiting of blood or coffee ground material  New chest pain or pain under the shoulder blades  Painful or persistently difficult swallowing  New shortness of breath  Fever of 100F or higher  Black, tarry-looking stools  For urgent or emergent issues, a gastroenterologist can be reached at any hour by calling [phone].   DIET:  We do recommend a small meal at first, but then you may proceed to your regular diet.  Drink plenty of fluids but you should avoid alcoholic beverages for 24  hours.  ACTIVITY:  You should plan to take it easy for the rest of today and you should NOT DRIVE or use heavy machinery until tomorrow (because of the sedation medicines used during the test).    FOLLOW UP: Our staff will call the number listed on your records 48-72 hours following your procedure to check on you and address any questions or concerns that you may have regarding the information given to you following your procedure. If we do not reach you, we will leave a message.  We will attempt to reach you two times.  During this call, we will ask if you have developed any symptoms of COVID 19. If you develop any symptoms (ie: fever, flu-like symptoms, shortness of breath, cough etc.) before then, please call [phone].  If you test positive for Covid 19 in the 2 weeks post procedure, please call and report this information to [place].    If any biopsies were taken you will be contacted by phone or by letter within the next 1-3 weeks.  Please call us at [phone] if you have not heard about the biopsies in 3 weeks.    SIGNATURES/CONFIDENTIALITY: You and/or your care partner have signed paperwork which will be entered into your electronic medical record.  These signatures attest to the fact that that the information above on your After Visit Summary has been reviewed and is understood.  Full responsibility of the confidentiality of this discharge information lies with you and/or your care-partner.

## 2019-02-22 NOTE — Progress Notes (Signed)
A/ox3, pleased with MAC, report to RN 

## 2019-02-27 ENCOUNTER — Encounter: Payer: Self-pay | Admitting: *Deleted

## 2019-02-28 ENCOUNTER — Telehealth: Payer: Self-pay

## 2019-02-28 NOTE — Telephone Encounter (Signed)
  Follow up Call-  Call back number 02/22/2019  Post procedure Call Back phone  # [phone]  Permission to leave phone message Yes     Patient questions:  Do you have a fever, pain , or abdominal swelling? No. Pain Score  0 *  Have you tolerated food without any problems? Yes.    Have you been able to return to your normal activities? Yes.    Do you have any questions about your discharge instructions: Diet   No. Medications  No. Follow up visit  No.  Do you have questions or concerns about your Care? No.  Actions: * If pain score is 4 or above: No action needed, pain <4.  Have you developed a fever since your procedure? no 2.   Have you had an respiratory symptoms (SOB or cough) since your procedure? no  3.   Have you tested positive for COVID 19 since your procedure no  4.   Have you had any family members/close contacts diagnosed with the COVID 19 since your procedure?  no   If yes to any of these questions please route to Joylene John, RN and Alphonsa Gin, Therapist, sports.

## 2019-03-07 ENCOUNTER — Encounter: Payer: Self-pay | Admitting: *Deleted

## 2019-03-07 ENCOUNTER — Other Ambulatory Visit: Payer: Self-pay | Admitting: *Deleted

## 2019-03-07 DIAGNOSIS — A048 Other specified bacterial intestinal infections: Secondary | ICD-10-CM

## 2019-03-07 MED ORDER — PYLERA 140-125-125 MG PO CAPS: 3.0000 | ORAL_CAPSULE | Freq: Three times a day (TID) | ORAL | 0 refills | Status: DC

## 2019-03-16 ENCOUNTER — Other Ambulatory Visit: Payer: Self-pay | Admitting: Gastroenterology

## 2019-03-16 ENCOUNTER — Other Ambulatory Visit: Payer: Self-pay | Admitting: Nurse Practitioner

## 2019-03-16 DIAGNOSIS — R1013 Epigastric pain: Secondary | ICD-10-CM

## 2019-03-16 DIAGNOSIS — R112 Nausea with vomiting, unspecified: Secondary | ICD-10-CM

## 2019-03-16 NOTE — Telephone Encounter (Signed)
Called to clarify with pt refill on medication no answer, LMTCB.

## 2019-03-20 ENCOUNTER — Ambulatory Visit: Payer: Self-pay | Admitting: Psychology

## 2019-03-20 ENCOUNTER — Telehealth: Payer: Self-pay | Admitting: Gastroenterology

## 2019-03-20 MED ORDER — NYSTATIN 100000 UNIT/ML MT SUSP: 5.0000 mL | Freq: Four times a day (QID) | OROMUCOSAL | 0 refills | Status: DC

## 2019-03-20 NOTE — Telephone Encounter (Signed)
Crystal Wagner pt calling, states she just finished pylera on Saturday. Pt thinks she may have thrush as she has noticed white patches on the back of her tongue. Dr. Carlean Purl as DOD please advise.

## 2019-03-20 NOTE — Telephone Encounter (Signed)
5 cc then thanks

## 2019-03-20 NOTE — Telephone Encounter (Signed)
Pt stated that she believes that she is developing oral thrush from Pylera.  She has white patches on the back of her tongue and would like to discuss.

## 2019-03-20 NOTE — Telephone Encounter (Signed)
Nystatin swish and swallow 10 cc qid x 5 days

## 2019-03-20 NOTE — Telephone Encounter (Signed)
Spoke with pt and he is aware, script sent to pharmacy. Per pharmacy the dose of 10cc QID exceeds the recommended dose. Suggested 5cc qid.

## 2019-03-27 ENCOUNTER — Telehealth: Payer: Self-pay | Admitting: Gastroenterology

## 2019-03-27 MED ORDER — FLUCONAZOLE 100 MG PO TABS: 100.0000 mg | ORAL_TABLET | Freq: Every day | ORAL | 0 refills | Status: DC

## 2019-03-27 NOTE — Telephone Encounter (Signed)
I am sorry that she is having difficulty. I recommend fluconazole 200 mg orally on day 1 then 100 mg orally daily for 7 days. Thank you.

## 2019-03-27 NOTE — Telephone Encounter (Signed)
Please advise 

## 2019-03-27 NOTE — Telephone Encounter (Signed)
Patient informed and Rx sent to pharmacy.  

## 2019-04-04 ENCOUNTER — Encounter: Payer: Self-pay | Admitting: Nurse Practitioner

## 2019-04-04 ENCOUNTER — Other Ambulatory Visit: Payer: Self-pay | Admitting: *Deleted

## 2019-04-04 MED ORDER — FLUCONAZOLE 200 MG PO TABS: 400.0000 mg | ORAL_TABLET | Freq: Every day | ORAL | 0 refills | Status: DC

## 2019-04-04 MED ORDER — FAMOTIDINE 20 MG PO TABS: 20.0000 mg | ORAL_TABLET | Freq: Two times a day (BID) | ORAL | 3 refills | Status: DC

## 2019-04-09 ENCOUNTER — Other Ambulatory Visit: Payer: Self-pay | Admitting: Gastroenterology

## 2019-04-09 DIAGNOSIS — R1013 Epigastric pain: Secondary | ICD-10-CM

## 2019-04-12 ENCOUNTER — Ambulatory Visit (INDEPENDENT_AMBULATORY_CARE_PROVIDER_SITE_OTHER): Payer: BC Managed Care – PPO | Admitting: Psychology

## 2019-04-12 DIAGNOSIS — F4323 Adjustment disorder with mixed anxiety and depressed mood: Secondary | ICD-10-CM | POA: Diagnosis not present

## 2019-04-13 ENCOUNTER — Encounter: Payer: Self-pay | Admitting: Gastroenterology

## 2019-04-13 ENCOUNTER — Ambulatory Visit: Payer: BC Managed Care – PPO | Admitting: Gastroenterology

## 2019-04-13 ENCOUNTER — Other Ambulatory Visit: Payer: Self-pay

## 2019-04-13 VITALS — BP 98/60 | HR 68 | Temp 97.9°F | Ht 65.0 in | Wt 109.5 lb

## 2019-04-13 DIAGNOSIS — A048 Other specified bacterial intestinal infections: Secondary | ICD-10-CM | POA: Diagnosis not present

## 2019-04-13 DIAGNOSIS — R1013 Epigastric pain: Secondary | ICD-10-CM | POA: Diagnosis not present

## 2019-04-13 NOTE — Progress Notes (Signed)
Referring Provider: Flossie Buffy, NP Primary Care Physician:  Flossie Buffy, NP  Chief complaint:  H pylori gastritis   IMPRESSION:  H pylori gastritis     - stool antigen + 01/04/2019- finished treatment 6 weeks ago    -Treated with clarithromycin, amoxicillin, and omeprazole    -persistent symptoms    -EGD confirmed h pylori gastritis    - completed 10 days of Pylera with resolution of symptoms Oral thrush, improving on fluconazole Dyspepsia    -Normal liver enzymes and lipase    -Celiac disease antibody panel negative    -Normal abdominal ultrasound    - improved with treatment of H pylori Iron deficiency without overt bleeding  Clinically improving after treatment with Pylera. Completing fluconazole for thrush. Continue famotidine 20 mg BID. Follow-up H pylori testing to document irradication.   PLAN: H pylori stool antigen 05/12/19 Continue famotidine 20 mg BID Complete fluconazole Follow-up PRN or if the H pylori stool antigen is positive  Please see the "Patient Instructions" section for addition details about the plan.  HPI: Crystal Wagner is a 29 y.o. female evaluation reflux and nausea after H pylori treatment. She returns after endoscopic evaluation.  The interval history is obtained through the patient and review of her electronic health record. 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   Diagnosed with H pylori by stool antigen in August 2020.  She felt better while on treatment with clarithromycin, amoxicillin, and omeprazole.  However, symptoms recurred 2 weeks after completing treatment.   Abdominal ultrasound was normal.   EGD 02/22/19 showed H pylori gastritis. Esophageal biopsies showed reflux.   Treated with Pylera x 10 days. GI symptoms resolved. Continues on famotidine 20 mg BID. Upper GI symptoms have largely resolved.   Developed thrush. Treated with fluconazole 200 mg 14 days.   Mouth findings have  improved. No sore throat. No dysphagia.   Not tolerating sugar due to nausea, vomiting, and diarrhea 2-3 times.  Was able to eat sugar without incident prior to August.   Recent abdominal imaging: Normal RUQ ultrasound 11/23/18.  Recent labs include:   H pylori stool antigen positive. Normal liver enzymes, lipase, and negative celiac disease antibody panel. B12 normal 363, iron 86, ferritin 38, percent saturation 21  No known family history of colon cancer or polyps. No family history of uterine/endometrial cancer, pancreatic cancer or gastric/stomach cancer.   Past Medical History:  Diagnosis Date  . Allergy   . Positive serology for Helicobacter pylori     Past Surgical History:  Procedure Laterality Date  . WISDOM TOOTH EXTRACTION      Current Outpatient Medications  Medication Sig Dispense Refill  . famotidine (PEPCID) 20 MG tablet Take 1 tablet (20 mg total) by mouth 2 (two) times daily. 60 tablet 3  . fluconazole (DIFLUCAN) 200 MG tablet Take 2 tablets (400 mg total) by mouth daily for 14 days. 28 tablet 0  . Norethindrone-Ethinyl Estradiol-Fe Biphas (LO LOESTRIN FE) 1 MG-10 MCG / 10 MCG tablet Take 1 tablet by mouth daily. 84 tablet 4  . bismuth-metronidazole-tetracycline (PYLERA) 140-125-125 MG capsule Take 3 capsules by mouth 4 (four) times daily -  before meals and at bedtime for 10 days. 120 capsule 0  . ondansetron (ZOFRAN ODT) 4 MG disintegrating tablet Take 1 tablet (4 mg total) by mouth every 8 (eight) hours as needed. (Patient not taking: Reported on 04/13/2019) 20 tablet 0   No current facility-administered medications for  this visit.    Allergies as of 04/13/2019  . (No Known Allergies)    Family History  Problem Relation Age of Onset  . Diabetes Maternal Aunt   . Kidney disease Maternal Aunt   . Hypertension Maternal Aunt   . Colon cancer Neg Hx   . Esophageal cancer Neg Hx   . Stomach cancer Neg Hx   . Pancreatic cancer Neg Hx   . Liver disease Neg  Hx   . Rectal cancer Neg Hx     Social History   Socioeconomic History  . Marital status: Married    Spouse name: Not on file  . Number of children: 0  . Years of education: 55  . Highest education level: Not on file  Occupational History  . Occupation: Runner, broadcasting/film/video  Tobacco Use  . Smoking status: Never Smoker  . Smokeless tobacco: Never Used  Substance and Sexual Activity  . Alcohol use: Yes    Comment: occ  . Drug use: Never  . Sexual activity: Yes    Partners: Male    Birth control/protection: Pill, None    Comment: 1st intercourse- 72, partners- 2, married- 10 yrs   Other Topics Concern  . Not on file  Social History Narrative   Right handed   No children   One story home      Social Determinants of Health   Financial Resource Strain:   . Difficulty of Paying Living Expenses: Not on file  Food Insecurity:   . Worried About Programme researcher, broadcasting/film/video in the Last Year: Not on file  . Ran Out of Food in the Last Year: Not on file  Transportation Needs:   . Lack of Transportation (Medical): Not on file  . Lack of Transportation (Non-Medical): Not on file  Physical Activity:   . Days of Exercise per Week: Not on file  . Minutes of Exercise per Session: Not on file  Stress:   . Feeling of Stress : Not on file  Social Connections:   . Frequency of Communication with Friends and Family: Not on file  . Frequency of Social Gatherings with Friends and Family: Not on file  . Attends Religious Services: Not on file  . Active Member of Clubs or Organizations: Not on file  . Attends Banker Meetings: Not on file  . Marital Status: Not on file  Intimate Partner Violence:   . Fear of Current or Ex-Partner: Not on file  . Emotionally Abused: Not on file  . Physically Abused: Not on file  . Sexually Abused: Not on file     Physical Exam: General:   Alert,  well-nourished, pleasant and cooperative in NAD Neurologic:  Alert and  oriented x4;  grossly nonfocal Skin:   No obvious rash or bruise Psych:  Alert and cooperative. Normal mood and affect.      Lile Mccurley L. Orvan Falconer, MD, MPH 04/13/2019, 10:47 AM

## 2019-04-13 NOTE — Patient Instructions (Addendum)
Continue taking famotidine twice daily.  Return next month for your H pylori test.   Call with any questions or concerns in the meantime.   If you are age 28 or older, your body mass index should be between 23-30. Your Body mass index is 18.22 kg/m. If this is out of the aforementioned range listed, please consider follow up with your Primary Care Provider.  If you are age 70 or younger, your body mass index should be between 19-25. Your Body mass index is 18.22 kg/m. If this is out of the aformentioned range listed, please consider follow up with your Primary Care Provider.

## 2019-04-17 ENCOUNTER — Encounter: Payer: Self-pay | Admitting: Nurse Practitioner

## 2019-04-17 ENCOUNTER — Ambulatory Visit (INDEPENDENT_AMBULATORY_CARE_PROVIDER_SITE_OTHER): Payer: BC Managed Care – PPO | Admitting: Nurse Practitioner

## 2019-04-17 ENCOUNTER — Other Ambulatory Visit: Payer: Self-pay

## 2019-04-17 VITALS — BP 92/74 | HR 102 | Temp 97.3°F | Ht 65.0 in | Wt 109.2 lb

## 2019-04-17 DIAGNOSIS — I951 Orthostatic hypotension: Secondary | ICD-10-CM | POA: Diagnosis not present

## 2019-04-17 DIAGNOSIS — R002 Palpitations: Secondary | ICD-10-CM

## 2019-04-17 LAB — CBC WITH DIFFERENTIAL/PLATELET
Basophils Absolute: 0.1 10*3/uL (ref 0.0–0.1)
Basophils Relative: 1.1 % (ref 0.0–3.0)
Eosinophils Absolute: 0.1 10*3/uL (ref 0.0–0.7)
Eosinophils Relative: 1.3 % (ref 0.0–5.0)
HCT: 41.7 % (ref 36.0–46.0)
Hemoglobin: 13.8 g/dL (ref 12.0–15.0)
Lymphocytes Relative: 29.2 % (ref 12.0–46.0)
Lymphs Abs: 1.6 10*3/uL (ref 0.7–4.0)
MCHC: 33 g/dL (ref 30.0–36.0)
MCV: 88.1 fl (ref 78.0–100.0)
Monocytes Absolute: 0.3 10*3/uL (ref 0.1–1.0)
Monocytes Relative: 5.5 % (ref 3.0–12.0)
Neutro Abs: 3.4 10*3/uL (ref 1.4–7.7)
Neutrophils Relative %: 62.9 % (ref 43.0–77.0)
Platelets: 252 10*3/uL (ref 150.0–400.0)
RBC: 4.73 Mil/uL (ref 3.87–5.11)
RDW: 12.2 % (ref 11.5–15.5)
WBC: 5.4 10*3/uL (ref 4.0–10.5)

## 2019-04-17 LAB — IBC + FERRITIN
Ferritin: 35.6 ng/mL (ref 10.0–291.0)
Iron: 164 ug/dL — ABNORMAL HIGH (ref 42–145)
Saturation Ratios: 36.8 % (ref 20.0–50.0)
Transferrin: 318 mg/dL (ref 212.0–360.0)

## 2019-04-17 LAB — BASIC METABOLIC PANEL
BUN: 15 mg/dL (ref 6–23)
CO2: 24 mEq/L (ref 19–32)
Calcium: 9.4 mg/dL (ref 8.4–10.5)
Chloride: 104 mEq/L (ref 96–112)
Creatinine, Ser: 0.77 mg/dL (ref 0.40–1.20)
GFR: 89.63 mL/min (ref >60.00)
Glucose, Bld: 76 mg/dL (ref 70–99)
Potassium: 4 mEq/L (ref 3.5–5.1)
Sodium: 138 mEq/L (ref 135–145)

## 2019-04-17 NOTE — Patient Instructions (Addendum)
Normal lab results. You will be contacted to schedule appt for holter monitor. Change position slowly. Maintain adequate oral hydration.  No need for additional oral thrush treatment at this time. Normal oral mucosa.  Hypotension As your heart beats, it forces blood through your body. This force is called blood pressure. If you have hypotension, you have low blood pressure. When your blood pressure is too low, you may not get enough blood to your brain or other parts of your body. This may cause you to feel weak, light-headed, have a fast heartbeat, or even pass out (faint). Low blood pressure may be harmless, or it may cause serious problems. What are the causes?  Blood loss.  Not enough water in the body (dehydration).  Heart problems.  Hormone problems.  Pregnancy.  A very bad infection.  Not having enough of certain nutrients.  Very bad allergic reactions.  Certain medicines. What increases the risk?  Age. The risk increases as you get older.  Conditions that affect the heart or the brain and spinal cord (central nervous system).  Taking certain medicines.  Being pregnant. What are the signs or symptoms?  Feeling: ? Weak. ? Light-headed. ? Dizzy. ? Tired (fatigued).  Blurred vision.  Fast heartbeat.  Passing out, in very bad cases. How is this treated?  Changing your diet. This may involve eating more salt (sodium) or drinking more water.  Taking medicines to raise your blood pressure.  Changing how much you take (the dosage) of some of your medicines.  Wearing compression stockings. These stockings help to prevent blood clots and reduce swelling in your legs. In some cases, you may need to go to the hospital for:  Fluid replacement. This means you will receive fluids through an IV tube.  Blood replacement. This means you will receive donated blood through an IV tube (transfusion).  Treating an infection or heart problems, if this  applies.  Monitoring. You may need to be monitored while medicines that you are taking wear off. Follow these instructions at home: Eating and drinking   Drink enough fluids to keep your pee (urine) pale yellow.  Eat a healthy diet. Follow instructions from your doctor about what you can eat or drink. A healthy diet includes: ? Fresh fruits and vegetables. ? Whole grains. ? Low-fat (lean) meats. ? Low-fat dairy products.  Eat extra salt only as told. Do not add extra salt to your diet unless your doctor tells you to.  Eat small meals often.  Avoid standing up quickly after you eat. Medicines  Take over-the-counter and prescription medicines only as told by your doctor. ? Follow instructions from your doctor about changing how much you take of your medicines, if this applies. ? Do not stop or change any of your medicines on your own. General instructions   Wear compression stockings as told by your doctor.  Get up slowly from lying down or sitting.  Avoid hot showers and a lot of heat as told by your doctor.  Return to your normal activities as told by your doctor. Ask what activities are safe for you.  Do not use any products that contain nicotine or tobacco, such as cigarettes, e-cigarettes, and chewing tobacco. If you need help quitting, ask your doctor.  Keep all follow-up visits as told by your doctor. This is important. Contact a doctor if:  You throw up (vomit).  You have watery poop (diarrhea).  You have a fever for more than 2-3 days.  You feel more  thirsty than normal.  You feel weak and tired. Get help right away if:  You have chest pain.  You have a fast or uneven heartbeat.  You lose feeling (have numbness) in any part of your body.  You cannot move your arms or your legs.  You have trouble talking.  You get sweaty or feel light-headed.  You pass out.  You have trouble breathing.  You have trouble staying awake.  You feel mixed up  (confused). Summary  Hypotension is also called low blood pressure. It is when the force of blood pumping through your arteries is too weak.  Hypotension may be harmless, or it may cause serious problems.  Treatment may include changing your diet and medicines, and wearing compression stockings.  In very bad cases, you may need to go to the hospital. This information is not intended to replace advice given to you by your health care provider. Make sure you discuss any questions you have with your health care provider. Document Revised: 09/09/2017 Document Reviewed: 09/09/2017 Elsevier Patient Education  [employer].

## 2019-04-17 NOTE — Progress Notes (Signed)
Subjective:  Patient ID: Crystal Wagner, female    DOB: [birth year]  Age: 29 y.o. MRN: [id number]  CC: Follow-up (trush in back of the throat,going on 3 wks--was on fluconazole and mouth wash but not go away/stop fluconazol due to side effects/ FYI--pt mention white discharge and swelling  near vaginal area for 2 days--but symotoms gone now. )  HPI Crystal Wagner present with concern about persistent oral thrush. Vaginal symptoms resolved with diflucan. Took diflucan x 15days. Denies any sore throat or dysphagia or swollen lymph node or hoarseness or nausea or ABD pain. Normal appetite has not returned with completion of pylera. She eats 1meals a day (states she has neever been able to eat large portions of food even prior to gastritis diagnosis). Lowest weight 105lbs Highest weight 115lbs She is also concerned about intermittent palpitation, fatigue, and dizziness in last 11months. Worse with prolonged standing. She has noticed heavy and prolonged menstrual cycle with oral abx use (small clots).She denies any syncope. No caffeine or nicotine or ETOH use at this time.  Continuous use of OCP. BP laying 118/70 BP sitting 84/60  Wt Readings from Last 3 Encounters:  04/17/19 109 lb 3.2 oz (49.5 kg)  04/13/19 109 lb 8 oz (49.7 kg)  02/22/19 111 lb (50.3 kg)   Reviewed past Medical, Social and Family history today.  Outpatient Medications Prior to Visit  Medication Sig Dispense Refill  . famotidine (PEPCID) 20 MG tablet Take 1 tablet (20 mg total) by mouth 2 (two) times daily. 60 tablet 3  . Norethindrone-Ethinyl Estradiol-Fe Biphas (LO LOESTRIN FE) 1 MG-10 MCG / 10 MCG tablet Take 1 tablet by mouth daily. 84 tablet 4  . ondansetron (ZOFRAN ODT) 4 MG disintegrating tablet Take 1 tablet (4 mg total) by mouth every 8 (eight) hours as needed. 20 tablet 0  . bismuth-metronidazole-tetracycline (PYLERA) 140-125-125 MG capsule Take 3 capsules by mouth 4 (four) times daily -  before meals and at bedtime  for 10 days. (Patient not taking: Reported on 04/17/2019) 120 capsule 0  . fluconazole (DIFLUCAN) 200 MG tablet Take 2 tablets (400 mg total) by mouth daily for 14 days. (Patient not taking: Reported on 04/17/2019) 28 tablet 0   No facility-administered medications prior to visit.    ROS See HPI  Objective:  BP 92/74   Pulse (!) 102   Temp (!) 97.3 F (36.3 C) (Tympanic)   Ht 5\' 5"  (1.651 m)   Wt 109 lb 3.2 oz (49.5 kg)   SpO2 99%   BMI 18.17 kg/m   BP Readings from Last 3 Encounters:  04/17/19 92/74  04/13/19 98/60  02/22/19 104/69    Wt Readings from Last 3 Encounters:  04/17/19 109 lb 3.2 oz (49.5 kg)  04/13/19 109 lb 8 oz (49.7 kg)  02/22/19 111 lb (50.3 kg)    Physical Exam Vitals reviewed.  HENT:     Nose: Nose normal.     Mouth/Throat:     Mouth: Mucous membranes are dry.     Pharynx: Oropharynx is clear. No oropharyngeal exudate or posterior oropharyngeal erythema.  Neck:     Thyroid: No thyroid mass or thyroid tenderness.  Cardiovascular:     Rate and Rhythm: Normal rate and regular rhythm.     Pulses: Normal pulses.     Heart sounds: Normal heart sounds.  Pulmonary:     Effort: Pulmonary effort is normal.     Breath sounds: Normal breath sounds.  Abdominal:     General: Bowel sounds  are normal.     Palpations: Abdomen is soft. There is no mass.     Tenderness: There is no abdominal tenderness. There is no guarding.  Musculoskeletal:     Cervical back: Normal range of motion and neck supple.     Right lower leg: No edema.     Left lower leg: No edema.  Lymphadenopathy:     Cervical: No cervical adenopathy.  Skin:    General: Skin is warm and dry.  Neurological:     Mental Status: She is alert and oriented to person, place, and time.  Psychiatric:        Mood and Affect: Mood normal.        Behavior: Behavior normal.        Thought Content: Thought content normal.    Lab Results  Component Value Date   WBC 5.7 11/21/2018   HGB 13.9  11/21/2018   HCT 41.9 11/21/2018   PLT 243.0 11/21/2018   GLUCOSE 112 (H) 11/21/2018   ALT 11 12/12/2018   AST 10 12/12/2018   NA 137 11/21/2018   K 3.7 11/21/2018   CL 102 11/21/2018   CREATININE 0.91 11/21/2018   BUN 13 11/21/2018   CO2 27 11/21/2018   TSH 2.60 01/30/2019    ECG: NSR with rate variation due to respiration, no previous ECG to compare.  Assessment & Plan:  This visit occurred during the SARS-CoV-2 public health emergency.  Safety protocols were in place, including screening questions prior to the visit, additional usage of staff PPE, and extensive cleaning of exam room while observing appropriate contact time as indicated for disinfecting solutions.   [name] was seen today for follow-up.  Diagnoses and all orders for this visit:  Orthostatic hypotension -     CBC w/Diff -     IBC + Ferritin -     Basic metabolic panel -     EKG 12-Lead -     Holter monitor - 48 hour; Future  Intermittent palpitations -     CBC w/Diff -     IBC + Ferritin -     Basic metabolic panel -     EKG 12-Lead -     Holter monitor - 48 hour; Future   I have discontinued Pam Reinertsen's Pylera and fluconazole. I am also having her maintain her ondansetron, Lo Loestrin Fe, and famotidine.  No orders of the defined types were placed in this encounter.   Problem List Items Addressed This Visit    None    Visit Diagnoses    Orthostatic hypotension    -  Primary   Relevant Orders   CBC w/Diff   IBC + Ferritin   Basic metabolic panel   EKG 12-Lead (Completed)   Holter monitor - 48 hour   Intermittent palpitations       Relevant Orders   CBC w/Diff   IBC + Ferritin   Basic metabolic panel   EKG 12-Lead (Completed)   Holter monitor - 48 hour      Follow-up: Return if symptoms worsen or fail to improve.  Alysia Penna, NP

## 2019-04-18 ENCOUNTER — Telehealth: Payer: Self-pay | Admitting: *Deleted

## 2019-04-18 NOTE — Telephone Encounter (Signed)
Patient enrolled for [employer] to mail a 3 day ZIO XT long term holter monitor to her home.  Instructions reviewed briefly as they are included in the monitor kit. 

## 2019-04-20 ENCOUNTER — Ambulatory Visit (INDEPENDENT_AMBULATORY_CARE_PROVIDER_SITE_OTHER): Payer: BC Managed Care – PPO

## 2019-04-20 ENCOUNTER — Encounter: Payer: Self-pay | Admitting: Nurse Practitioner

## 2019-04-20 DIAGNOSIS — R002 Palpitations: Secondary | ICD-10-CM | POA: Diagnosis not present

## 2019-04-20 DIAGNOSIS — I951 Orthostatic hypotension: Secondary | ICD-10-CM

## 2019-04-25 ENCOUNTER — Ambulatory Visit (INDEPENDENT_AMBULATORY_CARE_PROVIDER_SITE_OTHER): Payer: BC Managed Care – PPO | Admitting: Psychology

## 2019-04-25 DIAGNOSIS — F4323 Adjustment disorder with mixed anxiety and depressed mood: Secondary | ICD-10-CM

## 2019-05-08 ENCOUNTER — Telehealth: Payer: Self-pay | Admitting: Nurse Practitioner

## 2019-05-08 DIAGNOSIS — I951 Orthostatic hypotension: Secondary | ICD-10-CM

## 2019-05-08 DIAGNOSIS — R002 Palpitations: Secondary | ICD-10-CM

## 2019-05-08 NOTE — Telephone Encounter (Signed)
Notified Crystal Wagner about Holter monitor results. I explain findings and need to avoid medications like betablocker which can precipitate AV node block. Since holter monitor is unable to provide an explanation for orthostatic hypotension, I recommend referral to cardiology. She agreed with referral.

## 2019-05-12 ENCOUNTER — Encounter: Payer: Self-pay | Admitting: *Deleted

## 2019-05-15 ENCOUNTER — Other Ambulatory Visit: Payer: BC Managed Care – PPO

## 2019-05-15 DIAGNOSIS — A048 Other specified bacterial intestinal infections: Secondary | ICD-10-CM

## 2019-05-16 LAB — HELICOBACTER PYLORI  SPECIAL ANTIGEN
MICRO NUMBER:: 10152087
SPECIMEN QUALITY: ADEQUATE

## 2019-05-17 ENCOUNTER — Other Ambulatory Visit: Payer: Self-pay

## 2019-05-17 ENCOUNTER — Ambulatory Visit (INDEPENDENT_AMBULATORY_CARE_PROVIDER_SITE_OTHER): Payer: BC Managed Care – PPO | Admitting: Cardiology

## 2019-05-17 ENCOUNTER — Encounter: Payer: Self-pay | Admitting: *Deleted

## 2019-05-17 ENCOUNTER — Telehealth: Payer: Self-pay | Admitting: Gastroenterology

## 2019-05-17 ENCOUNTER — Encounter: Payer: Self-pay | Admitting: Cardiology

## 2019-05-17 DIAGNOSIS — I441 Atrioventricular block, second degree: Secondary | ICD-10-CM | POA: Diagnosis not present

## 2019-05-17 DIAGNOSIS — R011 Cardiac murmur, unspecified: Secondary | ICD-10-CM | POA: Diagnosis not present

## 2019-05-17 NOTE — Telephone Encounter (Signed)
If she is still feeling well, she could reduce the famotidine to 20 mg QAM for a week and then discontinue it all together. Thanks.

## 2019-05-17 NOTE — Telephone Encounter (Signed)
Pt stated that she tested negative for H. Pylori.  Pt would like to know whether to discontinue Pepcid.

## 2019-05-17 NOTE — Progress Notes (Signed)
Cardiology Office Note:    Date:  05/17/2019   ID:  Crystal Wagner, DOB 10-Sep-1990, MRN [id number]  PCP:  Flossie Buffy, NP  Cardiologist:  Jenean Lindau, MD   Referring MD: Flossie Buffy, NP    ASSESSMENT:    1. AV block, Mobitz 1   2. Cardiac murmur    PLAN:    In order of problems listed above:  1. I discussed my findings with the patient at extensive length and primary prevention stressed.  Importance of compliance with diet medication stressed and she vocalized understanding. 2. Abnormal monitoring with transient Mobitz 1 AV block: Medical management at this time.  I reassured her about my findings.  She is completely asymptomatic and I made sure I took a detailed history. 3. Her blood pressure is borderline and sometimes she has orthostasis-like symptoms and I encouraged fluid intake especially in the warm summer months.  She has never passed out. 4. Echocardiogram will be done to assess murmur on auscultation. 5. Patient will be seen in follow-up appointment in 4 months or earlier if the patient has any concerns    Medication Adjustments/Labs and Tests Ordered: Current medicines are reviewed at length with the patient today.  Concerns regarding medicines are outlined above.  No orders of the defined types were placed in this encounter.  No orders of the defined types were placed in this encounter.    History of Present Illness:    Crystal Wagner is a 29 y.o. female who is being seen today for the evaluation of abnormal event monitoring at the request of Nche, Charlene Brooke, NP.  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  She is overall in good health.  She denies any problems at this time and takes care of activities of daily living.  No chest pain orthopnea or PND.  Overall she is in good health.  She tells me that she was treated for Helicobacter pylori infection and subsequently has been fine.  She denies any history of  dizziness syncope or any such problems.  She was referred here because of abnormal event monitor depicting Mobitz 1 type II AV block.  At the time of my evaluation, the patient is alert awake oriented and in no distress.  Past Medical History:  Diagnosis Date  . Allergy   . Positive serology for Helicobacter pylori     Past Surgical History:  Procedure Laterality Date  . WISDOM TOOTH EXTRACTION      Current Medications: Current Meds  Medication Sig  . famotidine (PEPCID) 20 MG tablet Take 1 tablet (20 mg total) by mouth 2 (two) times daily.  . Norethindrone-Ethinyl Estradiol-Fe Biphas (LO LOESTRIN FE) 1 MG-10 MCG / 10 MCG tablet Take 1 tablet by mouth daily.     Allergies:   Patient has no known allergies.   Social History   Socioeconomic History  . Marital status: Married    Spouse name: Not on file  . Number of children: 0  . Years of education: 5  . Highest education level: Not on file  Occupational History  . Occupation: Pharmacist, hospital  Tobacco Use  . Smoking status: Never Smoker  . Smokeless tobacco: Never Used  Substance and Sexual Activity  . Alcohol use: Yes    Comment: occ  . Drug use: Never  . Sexual activity: Yes    Partners: Male    Birth control/protection: Pill, None    Comment: 1st intercourse- 69, partners- 2, married- 44 yrs  Other Topics Concern  . Not on file  Social History Narrative   Right handed   No children   One story home      Social Determinants of Health   Financial Resource Strain:   . Difficulty of Paying Living Expenses: Not on file  Food Insecurity:   . Worried About Programme researcher, broadcasting/film/video in the Last Year: Not on file  . Ran Out of Food in the Last Year: Not on file  Transportation Needs:   . Lack of Transportation (Medical): Not on file  . Lack of Transportation (Non-Medical): Not on file  Physical Activity:   . Days of Exercise per Week: Not on file  . Minutes of Exercise per Session: Not on file  Stress:   . Feeling of  Stress : Not on file  Social Connections:   . Frequency of Communication with Friends and Family: Not on file  . Frequency of Social Gatherings with Friends and Family: Not on file  . Attends Religious Services: Not on file  . Active Member of Clubs or Organizations: Not on file  . Attends Banker Meetings: Not on file  . Marital Status: Not on file     Family History: The patient's family history includes Diabetes in her maternal aunt; Hypertension in her maternal aunt; Kidney disease in her maternal aunt. There is no history of Colon cancer, Esophageal cancer, Stomach cancer, Pancreatic cancer, Liver disease, or Rectal cancer.  ROS:   Please see the history of present illness.    All other systems reviewed and are negative.  EKGs/Labs/Other Studies Reviewed:    The following studies were reviewed today: EKG reveals sinus rhythm and nonspecific ST-T changes.  Event monitor reveals Mobitz 1 AV block   Recent Labs: 12/12/2018: ALT 11 01/30/2019: TSH 2.60 04/17/2019: BUN 15; Creatinine, Ser 0.77; Hemoglobin 13.8; Platelets 252.0; Potassium 4.0; Sodium 138  Recent Lipid Panel No results found for: CHOL, TRIG, HDL, CHOLHDL, VLDL, LDLCALC, LDLDIRECT  Physical Exam:    VS:  Ht 5\' 5"  (1.651 m)   Wt 111 lb (50.3 kg)   SpO2 98%   BMI 18.47 kg/m     Wt Readings from Last 3 Encounters:  05/17/19 111 lb (50.3 kg)  04/17/19 109 lb 3.2 oz (49.5 kg)  04/13/19 109 lb 8 oz (49.7 kg)     GEN: Patient is in no acute distress HEENT: Normal NECK: No JVD; No carotid bruits LYMPHATICS: No lymphadenopathy CARDIAC: S1 S2 regular, 2/6 systolic murmur at the apex. RESPIRATORY:  Clear to auscultation without rales, wheezing or rhonchi  ABDOMEN: Soft, non-tender, non-distended MUSCULOSKELETAL:  No edema; No deformity  SKIN: Warm and dry NEUROLOGIC:  Alert and oriented x 3 PSYCHIATRIC:  Normal affect    Signed, 04/15/19, MD  05/17/2019 3:12 PM    [facility]

## 2019-05-17 NOTE — Telephone Encounter (Signed)
Please advise 

## 2019-05-17 NOTE — Telephone Encounter (Signed)
Patient notified via My Chart

## 2019-05-17 NOTE — Patient Instructions (Addendum)
Medication Instructions:  No medication changes *If you need a refill on your cardiac medications before your next appointment, please call your pharmacy*  Lab Work: None ordered If you have labs (blood work) drawn today and your tests are completely normal, you will receive your results only by: . MyChart Message (if you have MyChart) OR . A paper copy in the mail If you have any lab test that is abnormal or we need to change your treatment, we will call you to review the results.  Testing/Procedures: Your physician has requested that you have an echocardiogram. Echocardiography is a painless test that uses sound waves to create images of your heart. It provides your doctor with information about the size and shape of your heart and how well your heart's chambers and valves are working. This procedure takes approximately one hour. There are no restrictions for this procedure.    Follow-Up: At [facility], you and your health needs are our priority.  As part of our continuing mission to provide you with exceptional heart care, we have created designated Provider Care Teams.  These Care Teams include your primary Cardiologist (physician) and Advanced Practice Providers (APPs -  Physician Assistants and Nurse Practitioners) who all work together to provide you with the care you need, when you need it.  Your next appointment:   3 month(s)  The format for your next appointment:   In Person  Provider:   Rajan Revankar, MD  Other Instructions NA 

## 2019-05-23 ENCOUNTER — Ambulatory Visit (INDEPENDENT_AMBULATORY_CARE_PROVIDER_SITE_OTHER): Payer: BC Managed Care – PPO | Admitting: Psychology

## 2019-05-23 DIAGNOSIS — F4323 Adjustment disorder with mixed anxiety and depressed mood: Secondary | ICD-10-CM | POA: Diagnosis not present

## 2019-05-26 ENCOUNTER — Ambulatory Visit (HOSPITAL_BASED_OUTPATIENT_CLINIC_OR_DEPARTMENT_OTHER): Payer: BC Managed Care – PPO

## 2019-05-31 ENCOUNTER — Telehealth: Payer: Self-pay | Admitting: Nurse Practitioner

## 2019-05-31 NOTE — Telephone Encounter (Signed)
Patient is calling today to schedule a TOC visit  Patient would like to transfer to our office and start seeing Dr Selena Batten  Is the transfer okay?

## 2019-06-01 NOTE — Telephone Encounter (Signed)
I scheduled patient on 07/18/19 for her transfer of care appointment. Can she be seen sooner for acute?

## 2019-06-01 NOTE — Telephone Encounter (Signed)
This will need to wait for Dr. Elmyra Ricks return next week. Please notify patient that she is out of the office and won't return until Tuesday next week.

## 2019-06-01 NOTE — Telephone Encounter (Signed)
Patient called to schedule appointment.  Dr.Cody's first available appointment for a transfer is 07/18/19.  Patient wanted to be seen sooner to get a second opinion on her low blood pressure and heart monitor results. Can patient be seen sooner for an acute appointment or wait until April?

## 2019-06-01 NOTE — Telephone Encounter (Signed)
I had advised patient when I was talking to her that she probably wouldn't get a response until Dr.Cody returns next week.

## 2019-06-01 NOTE — Telephone Encounter (Signed)
ok 

## 2019-06-01 NOTE — Telephone Encounter (Signed)
Left message on voicemail Please schedule TOC visit when patient calls back

## 2019-06-01 NOTE — Telephone Encounter (Signed)
I don't see why this would be a problem with Dr. Selena Batten, she is out this week. Please approve the TOC to Dr. Selena Batten.

## 2019-06-02 ENCOUNTER — Ambulatory Visit (HOSPITAL_BASED_OUTPATIENT_CLINIC_OR_DEPARTMENT_OTHER): Payer: BC Managed Care – PPO

## 2019-06-07 NOTE — Telephone Encounter (Signed)
Left message for patient to call back  

## 2019-06-07 NOTE — Telephone Encounter (Signed)
Noted! Thank you

## 2019-06-07 NOTE — Telephone Encounter (Signed)
Is she wanting a different cardiologist's opinion? Or just primary care?   It is OK for her to have an earlier acute visit, but if seeking a separate cardiologists opinion she could call the cardiologists office and ask to see a different in-house provider.

## 2019-06-07 NOTE — Telephone Encounter (Signed)
I spoke to patient and she said she wasn't happy with the cardiologist she saw.  She saw her pcp told her she didn't need a cardiologist, so patient said she wants to start over.  Patient scheduled appointment with Dr.Cody on 06/12/19.

## 2019-06-09 ENCOUNTER — Ambulatory Visit: Payer: BC Managed Care – PPO | Admitting: Physician Assistant

## 2019-06-09 ENCOUNTER — Encounter: Payer: Self-pay | Admitting: Physician Assistant

## 2019-06-09 VITALS — BP 82/58 | HR 92 | Temp 97.9°F | Ht 65.0 in | Wt 109.2 lb

## 2019-06-09 DIAGNOSIS — K2900 Acute gastritis without bleeding: Secondary | ICD-10-CM | POA: Diagnosis not present

## 2019-06-09 DIAGNOSIS — K602 Anal fissure, unspecified: Secondary | ICD-10-CM

## 2019-06-09 DIAGNOSIS — K625 Hemorrhage of anus and rectum: Secondary | ICD-10-CM | POA: Diagnosis not present

## 2019-06-09 MED ORDER — AMBULATORY NON FORMULARY MEDICATION: 0 refills | Status: DC

## 2019-06-09 NOTE — Patient Instructions (Addendum)
If you are age 28 or older, your body mass index should be between 23-30. Your Body mass index is 18.17 kg/m. If this is out of the aforementioned range listed, please consider follow up with your Primary Care Provider.  If you are age 59 or younger, your body mass index should be between 19-25. Your Body mass index is 18.17 kg/m. If this is out of the aformentioned range listed, please consider follow up with your Primary Care Provider.   We have sent a prescription for nitroglycerin 0.125% gel to [facility]. You should apply a pea size amount to your rectum three times daily x 6-8 weeks.  Continue Famotidine daily Recticare with Lidocaine as needed.  [facility]'s information is below: Address: [address] [place], [place] [address]  [phone]  *Please DO NOT go directly from our office to pick up this medication! Give the pharmacy 1 day to process the prescription as this is compounded and takes time to make.   How to Take a [employer] A sitz bath is a warm water bath that may be used to care for your rectum, genital area, or the area between your rectum and genitals (perineum). For a sitz bath, the water only comes up to your hips and covers your buttocks. A sitz bath may done at home in a bathtub or with a portable sitz bath that fits over the toilet. Your health care provider may recommend a sitz bath to help:  Relieve pain and discomfort after delivering a baby.  Relieve pain and itching from hemorrhoids or anal fissures.  Relieve pain after certain surgeries.  Relax muscles that are sore or tight. How to take a sitz bath Take 3-4 sitz baths a day, or as many as told by your health care provider. Bathtub sitz bath To take a sitz bath in a bathtub: 1. Partially fill a bathtub with warm water. The water should be deep enough to cover your hips and buttocks when you are sitting in the tub. 2. If your health care provider told you to put medicine in  the water, follow his or her instructions. 3. Sit in the water. 4. Open the tub drain a little, and leave it open during your bath. 5. Turn on the warm water again, enough to replace the water that is draining out. Keep the water running throughout your bath. This helps keep the water at the right level and the right temperature. 6. Soak in the water for 15-20 minutes, or as long as told by your health care provider. 7. When you are done, be careful when you stand up. You may feel dizzy. 8. After the sitz bath, pat yourself dry. Do not rub your skin to dry it.  Over-the-toilet sitz bath To take a sitz bath with an over-the-toilet basin: 1. Follow the manufacturer's instructions. 2. Fill the basin with warm water. 3. If your health care provider told you to put medicine in the water, follow his or her instructions. 4. Sit on the seat. Make sure the water covers your buttocks and perineum. 5. Soak in the water for 15-20 minutes, or as long as told by your health care provider. 6. After the sitz bath, pat yourself dry. Do not rub your skin to dry it. 7. Clean and dry the basin between uses. 8. Discard the basin if it cracks, or according to the manufacturer's instructions. Contact a health care provider if:  Your symptoms get worse. Do not continue with  sitz baths if your symptoms get worse.  You have new symptoms. If this happens, do not continue with sitz baths until you talk with your health care provider. Summary  A sitz bath is a warm water bath in which the water only comes up to your hips and covers your buttocks.  A sitz bath may help relieve itching, relieve pain, and relax muscles that are sore or tight in the lower part of your body, including your genital area.  Take 3-4 sitz baths a day, or as many as told by your health care provider. Soak in the water for 15-20 minutes.  Do not continue with sitz baths if your symptoms get worse. This information is not intended to replace  advice given to you by your health care provider. Make sure you discuss any questions you have with your health care provider. Document Revised: 08/15/2018 Document Reviewed: 03/18/2017 Elsevier Patient Education  2020 [employer].

## 2019-06-09 NOTE — Progress Notes (Signed)
Reviewed and agree with management plans. ? ?Issak Goley L. Teya Otterson, MD, MPH  ?

## 2019-06-09 NOTE — Progress Notes (Signed)
Chief Complaint: Rectal bleeding  HPI:    Crystal Wagner is a 29 year old Caucasian female with a past medical history as listed below, known to Dr. Orvan Falconer for her H. pylori, who presents to clinic today with a complaint of rectal bleeding.    04/13/2019 patient seen in clinic by Dr. Orvan Falconer.  At that time H. pylori gastritis had improved after treatment with Pylera.  Her oral thrush improved on fluconazole.  She was noted to have iron deficiency without overt bleeding.  She was told to complete fluconazole for thrush and continue famotidine 20 mg twice daily.  H. pylori stool antigen was ordered for 05/12/2019.    05/15/2019 H. pylori fecal antigen was negative.    06/02/2019 patient notified Dr. Orvan Falconer that she had 2 bowel movements with some blood in her stool.  She was set a follow-up in our clinic.    Today, the patient tells me that last week she started with some rectal bleeding, this is quite a "significant amount", this was always with a bowel movement typically once a day and was described as bright red blood which is on the toilet paper as well as in the toilet.  One time when she is passing stool it kind of "stung", this stopped about 3 days ago and she is seen none since.    Tells me that all of her upper GI symptoms are better on famotidine 20 mg daily which she is continued.Tells me that all of her upper GI symptoms are better on Famotidine 20mg  qd which she has continued.    Denies fever, chills, family history of colon cancer, change in bowel habits, straining or diarrhea.  Past Medical History:  Diagnosis Date  . Allergy   . Positive serology for Helicobacter pylori     Past Surgical History:  Procedure Laterality Date  . WISDOM TOOTH EXTRACTION      Current Outpatient Medications  Medication Sig Dispense Refill  . famotidine (PEPCID) 20 MG tablet Take 1 tablet (20 mg total) by mouth 2 (two) times daily. 60 tablet 3  . Norethindrone-Ethinyl Estradiol-Fe Biphas (LO LOESTRIN  FE) 1 MG-10 MCG / 10 MCG tablet Take 1 tablet by mouth daily. 84 tablet 4  . ondansetron (ZOFRAN ODT) 4 MG disintegrating tablet Take 1 tablet (4 mg total) by mouth every 8 (eight) hours as needed. (Patient not taking: Reported on 05/17/2019) 20 tablet 0   No current facility-administered medications for this visit.    Allergies as of 06/09/2019  . (No Known Allergies)    Family History  Problem Relation Age of Onset  . Diabetes Maternal Aunt   . Kidney disease Maternal Aunt   . Hypertension Maternal Aunt   . Colon cancer Neg Hx   . Esophageal cancer Neg Hx   . Stomach cancer Neg Hx   . Pancreatic cancer Neg Hx   . Liver disease Neg Hx   . Rectal cancer Neg Hx     Social History   Socioeconomic History  . Marital status: Married    Spouse name: Not on file  . Number of children: 0  . Years of education: 11  . Highest education level: Not on file  Occupational History  . Occupation: 12  Tobacco Use  . Smoking status: Never Smoker  . Smokeless tobacco: Never Used  Substance and Sexual Activity  . Alcohol use: Yes    Comment: occ  . Drug use: Never  . Sexual activity: Yes    Partners: Male  Birth control/protection: Pill, None    Comment: 1st intercourse- 30, partners- 2, married- 10 yrs   Other Topics Concern  . Not on file  Social History Narrative   Right handed   No children   One story home      Social Determinants of Health   Financial Resource Strain:   . Difficulty of Paying Living Expenses:   Food Insecurity:   . Worried About Charity fundraiser in the Last Year:   . Arboriculturist in the Last Year:   Transportation Needs:   . Film/video editor (Medical):   Marland Kitchen Lack of Transportation (Non-Medical):   Physical Activity:   . Days of Exercise per Week:   . Minutes of Exercise per Session:   Stress:   . Feeling of Stress :   Social Connections:   . Frequency of Communication with Friends and Family:   . Frequency of Social Gatherings  with Friends and Family:   . Attends Religious Services:   . Active Member of Clubs or Organizations:   . Attends Archivist Meetings:   Marland Kitchen Marital Status:   Intimate Partner Violence:   . Fear of Current or Ex-Partner:   . Emotionally Abused:   Marland Kitchen Physically Abused:   . Sexually Abused:     Review of Systems:    Constitutional: No weight loss, fever or chills Cardiovascular: No chest pain   Respiratory: No SOB  Gastrointestinal: See HPI and otherwise negative   Physical Exam:  Vital signs: BP (!) 82/58   Pulse 92   Temp 97.9 F (36.6 C)   Ht 5\' 5"  (1.651 m)   Wt 109 lb 3.2 oz (49.5 kg)   BMI 18.17 kg/m    Constitutional:   Pleasant Caucasian female appears to be in NAD, Well developed, Well nourished, alert and cooperative Respiratory: Respirations even and unlabored. Lungs clear to auscultation bilaterally.   No wheezes, crackles, or rhonchi.  Cardiovascular: Normal S1, S2. No MRG. Regular rate and rhythm. No peripheral edema, cyanosis or pallor.  Gastrointestinal:  Soft, nondistended, nontender. No rebound or guarding. Normal bowel sounds. No appreciable masses or hepatomegaly. Rectal: External: visible healing posterior fissure with some ttp Psychiatric: Demonstrates good judgement and reason without abnormal affect or behaviors.  RELEVANT LABS AND IMAGING: CBC    Component Value Date/Time   WBC 5.4 04/17/2019 1148   RBC 4.73 04/17/2019 1148   HGB 13.8 04/17/2019 1148   HCT 41.7 04/17/2019 1148   PLT 252.0 04/17/2019 1148   MCV 88.1 04/17/2019 1148   MCHC 33.0 04/17/2019 1148   RDW 12.2 04/17/2019 1148   LYMPHSABS 1.6 04/17/2019 1148   MONOABS 0.3 04/17/2019 1148   EOSABS 0.1 04/17/2019 1148   BASOSABS 0.1 04/17/2019 1148    CMP     Component Value Date/Time   NA 138 04/17/2019 1148   K 4.0 04/17/2019 1148   CL 104 04/17/2019 1148   CO2 24 04/17/2019 1148   GLUCOSE 76 04/17/2019 1148   BUN 15 04/17/2019 1148   CREATININE 0.77 04/17/2019 1148     CALCIUM 9.4 04/17/2019 1148   PROT 6.9 12/12/2018 1504   ALBUMIN 4.8 12/12/2018 1504   AST 10 12/12/2018 1504   ALT 11 12/12/2018 1504   ALKPHOS 38 (L) 12/12/2018 1504   BILITOT 0.5 12/12/2018 1504    Assessment: 1. Anal Fissure: Posterior, seen at time of exam today, healing, patient described rectal bleeding and some pain for 6 days, stopped 2  days ago 2. Rectal bleeding: from above 3.  Gastritis: Symptoms controlled with famotidine 20 mg daily  Plan: 1.  Discussed anal fissure with the patient today. 2.  Prescribe Nitroglycerin ointment 0.125% to be applied 3 times daily x6-8 weeks. 3.  Discussed sitz bath's for 15-20 minutes/day 2-3 times a day. 4.  Discussed over-the-counter RectiCare cream with lidocaine for any pain. 5.  Encouraged patient to keep her stool soft and regular. 6.  Continue Famotidine 20 mg daily. 7.  Patient to follow in clinic with [place] as needed in the future.  Hyacinth Meeker, PA-C [facility] 06/09/2019, 3:40 PM  Cc: Anne Ng, NP

## 2019-06-12 ENCOUNTER — Other Ambulatory Visit: Payer: Self-pay

## 2019-06-12 ENCOUNTER — Encounter: Payer: Self-pay | Admitting: Family Medicine

## 2019-06-12 ENCOUNTER — Ambulatory Visit: Payer: BC Managed Care – PPO | Admitting: Family Medicine

## 2019-06-12 VITALS — BP 90/78 | HR 100 | Temp 98.1°F | Ht 65.0 in | Wt 111.8 lb

## 2019-06-12 DIAGNOSIS — R3915 Urgency of urination: Secondary | ICD-10-CM

## 2019-06-12 DIAGNOSIS — Z20822 Contact with and (suspected) exposure to covid-19: Secondary | ICD-10-CM

## 2019-06-12 DIAGNOSIS — R5383 Other fatigue: Secondary | ICD-10-CM

## 2019-06-12 DIAGNOSIS — R42 Dizziness and giddiness: Secondary | ICD-10-CM | POA: Diagnosis not present

## 2019-06-12 DIAGNOSIS — R011 Cardiac murmur, unspecified: Secondary | ICD-10-CM

## 2019-06-12 DIAGNOSIS — R202 Paresthesia of skin: Secondary | ICD-10-CM

## 2019-06-12 LAB — POCT URINALYSIS DIPSTICK
Bilirubin, UA: NEGATIVE
Blood, UA: NEGATIVE
Glucose, UA: NEGATIVE
Ketones, UA: NEGATIVE
Leukocytes, UA: NEGATIVE
Nitrite, UA: NEGATIVE
Protein, UA: NEGATIVE
Spec Grav, UA: <=1.005 — AB (ref 1.010–1.025)
Urobilinogen, UA: 0.2 E.U./dL
pH, UA: 6.5 (ref 5.0–8.0)

## 2019-06-12 LAB — TSH: TSH: 1.78 u[IU]/mL (ref 0.35–4.50)

## 2019-06-12 NOTE — Progress Notes (Signed)
Subjective:     Crystal Wagner is a 29 y.o. female presenting for Dizziness (and low blood pressure.)     HPI   #Dizziness - diagnosed with h pylori in August 2020 - treated for that - and no longer has symptoms - still has nausea and gastritis - with this she has had tingling in all extremities - went to neurologist - negative MRI - saw a therapist for stress - still getting tingling but less often - noticed some BP fluctuations - occasionally low and sometimes normal - would get asked if was low - will get lightheaded, low energy, like the room is fuzzy - diagnosed with orthostatic hypotension - wore a heart monitor - told she had a AV block and could go to the cardiologist - was told she was "fine" but that she needed an ECHO which would be $1200 - b12 was checked and was lower but told likely not the cause - [place] of abdomen which was normal - endoscopy - with gastritis  General day - tired - low energy - does not feel well enough to do exercise - which is not typical for her - lightheadedness -- once a week - less intense if she sits/lays downs, but is not always positional  Menses - is on lo estrin with light periods, but has had heavier periods for 2 months and 2 months w/o period  Continues to take pepcid daily -   Does not think that she got Covid  Low libido  Talking with a therapist has helped with increase stress   Review of Systems  Constitutional: Positive for fatigue. Negative for chills and fever.  HENT: Positive for postnasal drip and sore throat.   Eyes: Negative.   Respiratory: Negative.   Cardiovascular: Negative.   Gastrointestinal: Negative for abdominal pain, constipation and diarrhea.  Endocrine: Positive for cold intolerance.  Genitourinary: Positive for urgency. Negative for difficulty urinating, dysuria and menstrual problem.  Musculoskeletal: Positive for back pain (shoulder blade area).       Occasional rib pain  Skin: Negative.     Allergic/Immunologic: Negative.   Neurological: Positive for dizziness and light-headedness. Negative for tremors and weakness.       Tingling  Psychiatric/Behavioral:       Increased stress     Social History   Tobacco Use  Smoking Status Never Smoker  Smokeless Tobacco Never Used        Objective:    BP Readings from Last 3 Encounters:  06/12/19 90/78  06/09/19 (!) 82/58  05/17/19 108/72   Wt Readings from Last 3 Encounters:  06/12/19 111 lb 12 oz (50.7 kg)  06/09/19 109 lb 3.2 oz (49.5 kg)  05/17/19 111 lb (50.3 kg)    BP 90/78   Pulse 100   Temp 98.1 F (36.7 C)   Ht 5\' 5"  (1.651 m)   Wt 111 lb 12 oz (50.7 kg)   LMP 04/14/2019   SpO2 98%   BMI 18.60 kg/m    Physical Exam Constitutional:      General: She is not in acute distress.    Appearance: She is well-developed. She is not diaphoretic.  HENT:     Right Ear: External ear normal.     Left Ear: External ear normal.     Nose: Nose normal.  Eyes:     Conjunctiva/sclera: Conjunctivae normal.  Neck:     Thyroid: No thyroid mass, thyromegaly or thyroid tenderness.  Cardiovascular:     Rate and  Rhythm: Normal rate and regular rhythm.     Heart sounds: No murmur.  Pulmonary:     Effort: Pulmonary effort is normal. No respiratory distress.     Breath sounds: Normal breath sounds. No wheezing.  Musculoskeletal:     Cervical back: Neck supple.  Skin:    General: Skin is warm and dry.     Capillary Refill: Capillary refill takes less than 2 seconds.  Neurological:     Mental Status: She is alert. Mental status is at baseline.  Psychiatric:        Mood and Affect: Mood normal.        Behavior: Behavior normal.           Assessment & Plan:   Problem List Items Addressed This Visit      Other   Tingling in extremities - Primary   Relevant Orders   TSH   SARS CoV2 Serology(COVID19) AB(IgG,IgM),Immunoassay   Cardiac murmur    Unable to auscultate today. Reviewed cardiology note as pt  unaware. She will try treatment plan from today and get echo if no improvement.      Lightheadedness    Pt with several vague complaints and overall reassuring work-up. Will repeat TSH given that the lightheadedness and fatigue are new symptoms. Continue to monitor.       Relevant Orders   TSH   SARS CoV2 Serology(COVID19) AB(IgG,IgM),Immunoassay   Fatigue    Ferritin <50. Advised trial of iron supplement and potential for b12 supplement trial of iron w/o improvement. Repeat TSH and screen for covid antibodies as several exposures but pt never had symptoms though vague complaints could be potential for long haul covid symptoms. Discussed a negative test does not necessary rule out but a positive test could be informative.        Other Visit Diagnoses    Close exposure to COVID-19 virus       Relevant Orders   SARS CoV2 Serology(COVID19) AB(IgG,IgM),Immunoassay   Urinary urgency       Relevant Orders   POCT urinalysis dipstick (Completed)       Return in about 4 weeks (around 07/10/2019).  Lynnda Child, MD

## 2019-06-12 NOTE — Assessment & Plan Note (Signed)
Unable to auscultate today. Reviewed cardiology note as pt unaware. She will try treatment plan from today and get echo if no improvement.

## 2019-06-12 NOTE — Assessment & Plan Note (Signed)
Pt with several vague complaints and overall reassuring work-up. Will repeat TSH given that the lightheadedness and fatigue are new symptoms. Continue to monitor.

## 2019-06-12 NOTE — Assessment & Plan Note (Signed)
Ferritin <50. Advised trial of iron supplement and potential for b12 supplement trial of iron w/o improvement. Repeat TSH and screen for covid antibodies as several exposures but pt never had symptoms though vague complaints could be potential for long haul covid symptoms. Discussed a negative test does not necessary rule out but a positive test could be informative.

## 2019-06-12 NOTE — Patient Instructions (Signed)
Feeling "off"  - Start a daily Iron supplement - we can recheck ferritin in 6 weeks  - Consider starting daily B12 supplement if not noticing a difference with the iron alone  We will check thyroid today

## 2019-06-13 LAB — SARS COV-2 SEROLOGY(COVID-19)AB(IGG,IGM),IMMUNOASSAY
SARS CoV-2 AB IgG: NEGATIVE
SARS CoV-2 IgM: NEGATIVE

## 2019-06-16 ENCOUNTER — Ambulatory Visit: Payer: BC Managed Care – PPO | Attending: Internal Medicine

## 2019-06-16 DIAGNOSIS — Z23 Encounter for immunization: Secondary | ICD-10-CM

## 2019-06-16 NOTE — Progress Notes (Signed)
   Covid-19 Vaccination Clinic  Name:  Crystal Wagner    MRN: [id number] DOB: 1990-05-20  06/16/2019  [name] was observed post Covid-19 immunization for 15 minutes without incident. She was provided with Vaccine Information Sheet and instruction to access the V-Safe system.   [name] was instructed to call 911 with any severe reactions post vaccine: Marland Kitchen Difficulty breathing  . Swelling of face and throat  . A fast heartbeat  . A bad rash all over body  . Dizziness and weakness   Immunizations Administered    Name Date Dose VIS Date Route   Pfizer COVID-19 Vaccine 06/16/2019  4:52 PM 0.3 mL 03/10/2019 Intramuscular   Manufacturer: [employer], [employer]   Lot: [id number]   NDC: [address]-3

## 2019-06-20 ENCOUNTER — Encounter: Payer: Self-pay | Admitting: Family Medicine

## 2019-06-21 ENCOUNTER — Encounter: Payer: Self-pay | Admitting: Family Medicine

## 2019-07-10 ENCOUNTER — Ambulatory Visit: Payer: BC Managed Care – PPO | Attending: Internal Medicine

## 2019-07-10 DIAGNOSIS — Z23 Encounter for immunization: Secondary | ICD-10-CM

## 2019-07-10 NOTE — Progress Notes (Signed)
   Covid-19 Vaccination Clinic  Name:  Crystal Wagner    MRN: [id number] DOB: 03-25-1991  07/10/2019  Crystal Wagner was observed post Covid-19 immunization for 15 minutes without incident. She was provided with Vaccine Information Sheet and instruction to access the V-Safe system.   Crystal Wagner was instructed to call 911 with any severe reactions post vaccine: Marland Kitchen Difficulty breathing  . Swelling of face and throat  . A fast heartbeat  . A bad rash all over body  . Dizziness and weakness   Immunizations Administered    Name Date Dose VIS Date Route   Pfizer COVID-19 Vaccine 07/10/2019 12:53 PM 0.3 mL 03/10/2019 Intramuscular   Manufacturer: [employer], [employer]   Lot: [id number]   NDC: [address]-9

## 2019-07-12 ENCOUNTER — Ambulatory Visit: Payer: BC Managed Care – PPO | Admitting: Psychology

## 2019-07-17 ENCOUNTER — Ambulatory Visit: Payer: Self-pay

## 2019-07-18 ENCOUNTER — Encounter: Payer: BC Managed Care – PPO | Admitting: Family Medicine

## 2019-07-26 ENCOUNTER — Ambulatory Visit (INDEPENDENT_AMBULATORY_CARE_PROVIDER_SITE_OTHER): Payer: BC Managed Care – PPO | Admitting: Psychology

## 2019-07-26 DIAGNOSIS — F4323 Adjustment disorder with mixed anxiety and depressed mood: Secondary | ICD-10-CM

## 2019-07-31 ENCOUNTER — Other Ambulatory Visit: Payer: Self-pay

## 2019-07-31 ENCOUNTER — Ambulatory Visit (INDEPENDENT_AMBULATORY_CARE_PROVIDER_SITE_OTHER): Payer: BC Managed Care – PPO | Admitting: Family Medicine

## 2019-07-31 ENCOUNTER — Encounter: Payer: Self-pay | Admitting: Family Medicine

## 2019-07-31 VITALS — BP 94/62 | HR 77 | Temp 98.9°F | Resp 18 | Ht 65.25 in | Wt 110.2 lb

## 2019-07-31 DIAGNOSIS — R790 Abnormal level of blood mineral: Secondary | ICD-10-CM

## 2019-07-31 DIAGNOSIS — R202 Paresthesia of skin: Secondary | ICD-10-CM | POA: Diagnosis not present

## 2019-07-31 DIAGNOSIS — Z114 Encounter for screening for human immunodeficiency virus [HIV]: Secondary | ICD-10-CM

## 2019-07-31 DIAGNOSIS — Z23 Encounter for immunization: Secondary | ICD-10-CM | POA: Diagnosis not present

## 2019-07-31 DIAGNOSIS — R5383 Other fatigue: Secondary | ICD-10-CM

## 2019-07-31 NOTE — Assessment & Plan Note (Signed)
Improved, but persisting. Reviewed neurology note and recommendation for EMG studies - though also discussed that symptoms improved and could do watch and wait. She will do watch and wait. Also discussed option of gabapentin, though given fewer symptoms would not favor this.

## 2019-07-31 NOTE — Patient Instructions (Signed)
We will check Ferritin today  Monitor tingling symptoms - if not resolved in 1 month -- call neurology - Or reach out if you want to try medicine    Gabapentin capsules or tablets What is this medicine? GABAPENTIN (GA ba pen tin) is used to control seizures in certain types of epilepsy. It is also used to treat certain types of nerve pain. This medicine may be used for other purposes; ask your health care provider or pharmacist if you have questions. COMMON BRAND NAME(S): Active-PAC with Gabapentin, Gabarone, Neurontin What should I tell my health care provider before I take this medicine? They need to know if you have any of these conditions:  history of drug abuse or alcohol abuse problem  kidney disease  lung or breathing disease  suicidal thoughts, plans, or attempt; a previous suicide attempt by you or a family member  an unusual or allergic reaction to gabapentin, other medicines, foods, dyes, or preservatives  pregnant or trying to get pregnant  breast-feeding How should I use this medicine? Take this medicine by mouth with a glass of water. Follow the directions on the prescription label. You can take it with or without food. If it upsets your stomach, take it with food. Take your medicine at regular intervals. Do not take it more often than directed. Do not stop taking except on your doctor's advice. If you are directed to break the 600 or 800 mg tablets in half as part of your dose, the extra half tablet should be used for the next dose. If you have not used the extra half tablet within 28 days, it should be thrown away. A special MedGuide will be given to you by the pharmacist with each prescription and refill. Be sure to read this information carefully each time. Talk to your pediatrician regarding the use of this medicine in children. While this drug may be prescribed for children as young as 3 years for selected conditions, precautions do apply. Overdosage: If you  think you have taken too much of this medicine contact a poison control center or emergency room at once. NOTE: This medicine is only for you. Do not share this medicine with others. What if I miss a dose? If you miss a dose, take it as soon as you can. If it is almost time for your next dose, take only that dose. Do not take double or extra doses. What may interact with this medicine? This medicine may interact with the following medications:  alcohol  antihistamines for allergy, cough, and cold  certain medicines for anxiety or sleep  certain medicines for depression like amitriptyline, fluoxetine, sertraline  certain medicines for seizures like phenobarbital, primidone  certain medicines for stomach problems  general anesthetics like halothane, isoflurane, methoxyflurane, propofol  local anesthetics like lidocaine, pramoxine, tetracaine  medicines that relax muscles for surgery  narcotic medicines for pain  phenothiazines like chlorpromazine, mesoridazine, prochlorperazine, thioridazine This list may not describe all possible interactions. Give your health care provider a list of all the medicines, herbs, non-prescription drugs, or dietary supplements you use. Also tell them if you smoke, drink alcohol, or use illegal drugs. Some items may interact with your medicine. What should I watch for while using this medicine? Visit your doctor or health care provider for regular checks on your progress. You may want to keep a record at home of how you feel your condition is responding to treatment. You may want to share this information with your doctor or health  care provider at each visit. You should contact your doctor or health care provider if your seizures get worse or if you have any new types of seizures. Do not stop taking this medicine or any of your seizure medicines unless instructed by your doctor or health care provider. Stopping your medicine suddenly can increase your  seizures or their severity. This medicine may cause serious skin reactions. They can happen weeks to months after starting the medicine. Contact your health care provider right away if you notice fevers or flu-like symptoms with a rash. The rash may be red or purple and then turn into blisters or peeling of the skin. Or, you might notice a red rash with swelling of the face, lips or lymph nodes in your neck or under your arms. Wear a medical identification bracelet or chain if you are taking this medicine for seizures, and carry a card that lists all your medications. You may get drowsy, dizzy, or have blurred vision. Do not drive, use machinery, or do anything that needs mental alertness until you know how this medicine affects you. To reduce dizzy or fainting spells, do not sit or stand up quickly, especially if you are an older patient. Alcohol can increase drowsiness and dizziness. Avoid alcoholic drinks. Your mouth may get dry. Chewing sugarless gum or sucking hard candy, and drinking plenty of water will help. The use of this medicine may increase the chance of suicidal thoughts or actions. Pay special attention to how you are responding while on this medicine. Any worsening of mood, or thoughts of suicide or dying should be reported to your health care provider right away. Women who become pregnant while using this medicine may enroll in the [place] American Antiepileptic Drug Pregnancy Registry by calling [phone]. This registry collects information about the safety of antiepileptic drug use during pregnancy. What side effects may I notice from receiving this medicine? Side effects that you should report to your doctor or health care professional as soon as possible:  allergic reactions like skin rash, itching or hives, swelling of the face, lips, or tongue  breathing problems  rash, fever, and swollen lymph nodes  redness, blistering, peeling or loosening of the skin, including inside  the mouth  suicidal thoughts, mood changes Side effects that usually do not require medical attention (report to your doctor or health care professional if they continue or are bothersome):  dizziness  drowsiness  headache  nausea, vomiting  swelling of ankles, feet, hands  tiredness This list may not describe all possible side effects. Call your doctor for medical advice about side effects. You may report side effects to FDA at 1-800-FDA-1088. Where should I keep my medicine? Keep out of reach of children. This medicine may cause accidental overdose and death if it taken by other adults, children, or pets. Mix any unused medicine with a substance like cat litter or coffee grounds. Then throw the medicine away in a sealed container like a sealed bag or a coffee can with a lid. Do not use the medicine after the expiration date. Store at room temperature between 15 and 30 degrees C (59 and 86 degrees F). NOTE: This sheet is a summary. It may not cover all possible information. If you have questions about this medicine, talk to your doctor, pharmacist, or health care provider.  2020 Elsevier/Gold Standard (2018-06-17 14:16:43)

## 2019-07-31 NOTE — Addendum Note (Signed)
Addended by: Consuella Lose on: 07/31/2019 04:56 PM   Modules accepted: Orders

## 2019-07-31 NOTE — Progress Notes (Signed)
   Subjective:     Crystal Wagner is a 29 y.o. female presenting for Transfer of Care (from [facility]) and Tingling and numbness of arms and hands (still present)     HPI  #Tingling and numbness - not as intense - and not as often - symptoms now - are intermittent - has been every day for the last few days but before then had gone 2 weeks - prior was feet/legs too but this has resolved - now just hands/arms  Has been on iron for 1 month  Review of Systems   Social History   Tobacco Use  Smoking Status Never Smoker  Smokeless Tobacco Never Used        Objective:    BP Readings from Last 3 Encounters:  07/31/19 94/62  06/12/19 90/78  06/09/19 (!) 82/58   Wt Readings from Last 3 Encounters:  07/31/19 110 lb 4 oz (50 kg)  06/12/19 111 lb 12 oz (50.7 kg)  06/09/19 109 lb 3.2 oz (49.5 kg)    BP 94/62   Pulse 77   Temp 98.9 F (37.2 C)   Resp 18   Ht 5' 5.25" (1.657 m)   Wt 110 lb 4 oz (50 kg)   LMP 07/01/2019   SpO2 98%   BMI 18.21 kg/m    Physical Exam Constitutional:      General: She is not in acute distress.    Appearance: She is well-developed. She is not diaphoretic.  HENT:     Right Ear: External ear normal.     Left Ear: External ear normal.     Nose: Nose normal.  Eyes:     Conjunctiva/sclera: Conjunctivae normal.  Cardiovascular:     Rate and Rhythm: Normal rate.  Pulmonary:     Effort: Pulmonary effort is normal.  Musculoskeletal:     Cervical back: Neck supple.     Comments: Neg phalen and tinel signs  Skin:    General: Skin is warm and dry.     Capillary Refill: Capillary refill takes less than 2 seconds.  Neurological:     Mental Status: She is alert. Mental status is at baseline.  Psychiatric:        Mood and Affect: Mood normal.        Behavior: Behavior normal.           Assessment & Plan:   Problem List Items Addressed This Visit      Other   Tingling in extremities    Improved, but persisting. Reviewed  neurology note and recommendation for EMG studies - though also discussed that symptoms improved and could do watch and wait. She will do watch and wait. Also discussed option of gabapentin, though given fewer symptoms would not favor this.       Fatigue    Improving with iron supplementation. Will check ferritin today. If >50 could try stopping or taking every other day and increasing if symptoms return.        Other Visit Diagnoses    Low ferritin    -  Primary   Relevant Orders   Ferritin   Screening for HIV (human immunodeficiency virus)       Relevant Orders   HIV Antibody (routine testing w rflx)       Return in about 1 year (around 07/30/2020), or if symptoms worsen or fail to improve.  Lynnda Child, MD

## 2019-07-31 NOTE — Assessment & Plan Note (Signed)
Improving with iron supplementation. Will check ferritin today. If >50 could try stopping or taking every other day and increasing if symptoms return.

## 2019-08-01 LAB — HIV ANTIBODY (ROUTINE TESTING W REFLEX): HIV 1&2 Ab, 4th Generation: NONREACTIVE

## 2019-08-01 LAB — FERRITIN: Ferritin: 39.4 ng/mL (ref 10.0–291.0)

## 2019-08-16 ENCOUNTER — Ambulatory Visit (INDEPENDENT_AMBULATORY_CARE_PROVIDER_SITE_OTHER): Payer: BC Managed Care – PPO | Admitting: Psychology

## 2019-08-16 DIAGNOSIS — F4323 Adjustment disorder with mixed anxiety and depressed mood: Secondary | ICD-10-CM | POA: Diagnosis not present

## 2019-08-30 ENCOUNTER — Ambulatory Visit: Payer: BC Managed Care – PPO | Admitting: Cardiology

## 2019-09-10 ENCOUNTER — Encounter: Payer: Self-pay | Admitting: Family Medicine

## 2020-01-08 ENCOUNTER — Encounter: Payer: Self-pay | Admitting: Obstetrics & Gynecology

## 2020-01-08 ENCOUNTER — Ambulatory Visit: Payer: BC Managed Care – PPO | Admitting: Obstetrics & Gynecology

## 2020-01-08 ENCOUNTER — Other Ambulatory Visit: Payer: Self-pay

## 2020-01-08 VITALS — BP 114/70 | Ht 65.0 in | Wt 107.0 lb

## 2020-01-08 DIAGNOSIS — Z01419 Encounter for gynecological examination (general) (routine) without abnormal findings: Secondary | ICD-10-CM

## 2020-01-08 DIAGNOSIS — Z789 Other specified health status: Secondary | ICD-10-CM

## 2020-01-08 NOTE — Progress Notes (Signed)
    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  Neuro Sx of tingling of upper/lower extremities resolved spontaneously after that.  Menses regular normal every month.  No BTB.  Using condoms.  No pelvic pain.  No pain with intercourse.  Urine and bowel movements normal.  Breasts normal.  Body mass index decreased to 17.81.  Good fitness.  Health labs with family MD.   Past medical history,surgical history, family history and social history were all reviewed and documented in the EPIC chart.  Gynecologic History No LMP recorded.  Obstetric History OB History  Gravida Para Term Preterm AB Living  0 0 0 0 0 0  SAB TAB Ectopic Multiple Live Births  0 0 0 0 0     ROS: A ROS was performed and pertinent positives and negatives are included in the history.  GENERAL: No fevers or chills. HEENT: No change in vision, no earache, sore throat or sinus congestion. NECK: No pain or stiffness. CARDIOVASCULAR: No chest pain or pressure. No palpitations. PULMONARY: No shortness of breath, cough or wheeze. GASTROINTESTINAL: No abdominal pain, nausea, vomiting or diarrhea, melena or bright red blood per rectum. GENITOURINARY: No urinary frequency, urgency, hesitancy or dysuria. MUSCULOSKELETAL: No joint or muscle pain, no back pain, no recent trauma. DERMATOLOGIC: No rash, no itching, no lesions. ENDOCRINE: No polyuria, polydipsia, no heat or cold intolerance. No recent change in weight. HEMATOLOGICAL: No anemia or easy bruising or bleeding. NEUROLOGIC: No headache, seizures, numbness, tingling or weakness. PSYCHIATRIC: No depression, no loss of interest in normal activity or change in sleep pattern.     Exam:  BP  114/70    Weight  107lb(48.5kg)   Height  5'5"(1.645m)   Other Vitals  BMI 17.81 kg/m2  BSA 1.49 m2      General appearance : Well  developed well nourished female. No acute distress HEENT: Eyes: no retinal hemorrhage or exudates,  Neck supple, trachea midline, no carotid bruits, no thyroidmegaly Lungs: Clear to auscultation, no rhonchi or wheezes, or rib retractions  Heart: Regular rate and rhythm, no murmurs or gallops Breast:Examined in sitting and supine position were symmetrical in appearance, no palpable masses or tenderness,  no skin retraction, no nipple inversion, no nipple discharge, no skin discoloration, no axillary or supraclavicular lymphadenopathy Abdomen: no palpable masses or tenderness, no rebound or guarding Extremities: no edema or skin discoloration or tenderness  Pelvic: Vulva: Normal             Vagina: No gross lesions or discharge  Cervix: No gross lesions or discharge  Uterus AV, normal size, shape and consistency, non-tender and mobile  Adnexa  Without masses or tenderness  Anus: Normal   Assessment/Plan:  29 y.o. female for annual exam   1. Well female exam with routine gynecological exam Normal gynecologic exam.  Pap test in October 2020 was negative, no indication to repeat a Pap test this year.  Breast exam normal.  Body mass index 17.81.  Recommend increasing protein intake and total calories.  Continue with fitness.  Health labs as needed with family physician.  2. Use of condoms for contraception Declines alternatives.  Genia Del MD, 4:18 PM 01/08/2020

## 2020-01-09 ENCOUNTER — Encounter: Payer: Self-pay | Admitting: Obstetrics & Gynecology

## 2020-01-23 ENCOUNTER — Ambulatory Visit (INDEPENDENT_AMBULATORY_CARE_PROVIDER_SITE_OTHER): Payer: BC Managed Care – PPO | Admitting: Psychology

## 2020-01-23 DIAGNOSIS — F4323 Adjustment disorder with mixed anxiety and depressed mood: Secondary | ICD-10-CM | POA: Diagnosis not present

## 2020-02-06 ENCOUNTER — Ambulatory Visit (INDEPENDENT_AMBULATORY_CARE_PROVIDER_SITE_OTHER): Payer: BC Managed Care – PPO | Admitting: Psychology

## 2020-02-06 DIAGNOSIS — F4323 Adjustment disorder with mixed anxiety and depressed mood: Secondary | ICD-10-CM | POA: Diagnosis not present

## 2020-02-28 ENCOUNTER — Ambulatory Visit (INDEPENDENT_AMBULATORY_CARE_PROVIDER_SITE_OTHER): Payer: BC Managed Care – PPO | Admitting: Psychology

## 2020-02-28 DIAGNOSIS — F4323 Adjustment disorder with mixed anxiety and depressed mood: Secondary | ICD-10-CM

## 2020-03-11 ENCOUNTER — Ambulatory Visit (INDEPENDENT_AMBULATORY_CARE_PROVIDER_SITE_OTHER): Payer: BC Managed Care – PPO | Admitting: Psychology

## 2020-03-11 DIAGNOSIS — F4323 Adjustment disorder with mixed anxiety and depressed mood: Secondary | ICD-10-CM | POA: Diagnosis not present

## 2020-04-08 ENCOUNTER — Ambulatory Visit (INDEPENDENT_AMBULATORY_CARE_PROVIDER_SITE_OTHER): Payer: BC Managed Care – PPO | Admitting: Psychology

## 2020-04-08 DIAGNOSIS — F4323 Adjustment disorder with mixed anxiety and depressed mood: Secondary | ICD-10-CM | POA: Diagnosis not present

## 2020-04-29 ENCOUNTER — Ambulatory Visit (INDEPENDENT_AMBULATORY_CARE_PROVIDER_SITE_OTHER): Payer: BC Managed Care – PPO | Admitting: Psychology

## 2020-04-29 DIAGNOSIS — F4323 Adjustment disorder with mixed anxiety and depressed mood: Secondary | ICD-10-CM | POA: Diagnosis not present

## 2020-08-01 IMAGING — MR MR HEAD WO/W CM
10 series · 48 of 48 positions shown · IV contrast (multihance)
Comparison: None.

CLINICAL DATA: Intermittent tingling arms and legs

EXAM:
MRI HEAD WITHOUT AND WITH CONTRAST
TECHNIQUE: Multiplanar, multiecho pulse sequences of the brain and surrounding
structures were obtained without and with intravenous contrast.
CONTRAST:  10mL MULTIHANCE GADOBENATE DIMEGLUMINE 529 MG/ML IV SOLN

[Series 3: T1 · sagittal · 5.0mm · 0.45mm/px · 1 of 21 slices shown]
[im 1/21]
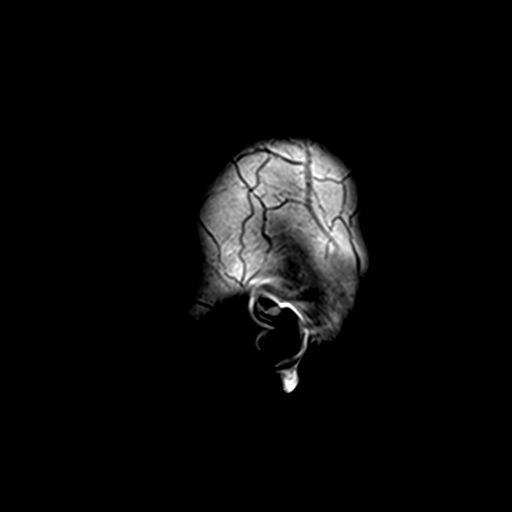

[Series 4: DWI · axial · 3.0mm · 1.80mm/px · z∈[-58,+77]mm · 7 of 91 slices shown (1 of 2)]
[im 1/91]
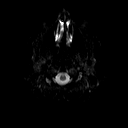
[im 16/91]
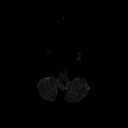
[im 31/91]
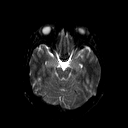
[im 46/91]
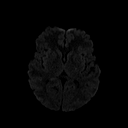
[im 61/91]
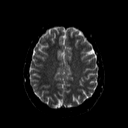
[im 76/91]
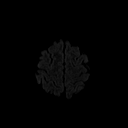
[im 91/91]
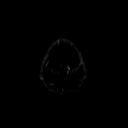

[Series 5: DWI · axial · 3.0mm · 1.80mm/px · z∈[-58,+77]mm · 4 of 46 slices shown (2 of 2)]
[im 1/46]
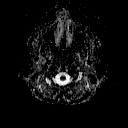
[im 16/46]
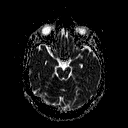
[im 31/46]
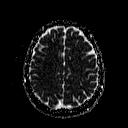
[im 46/46]
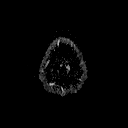

[Series 6: T2 · axial · 5.0mm · 0.60mm/px · z∈[-58,+78]mm · 2 of 22 slices shown (1 of 2)]
[im 1/22]
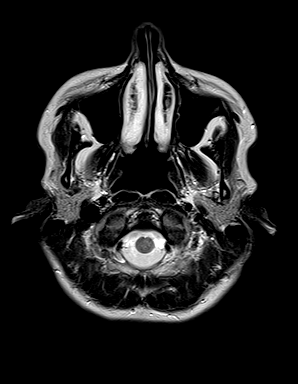
[im 22/22]
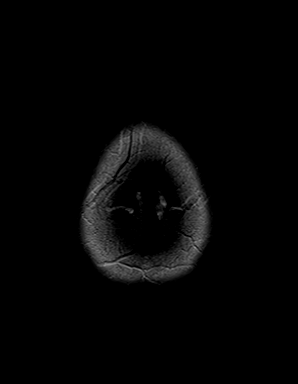

[Series 7: FLAIR · axial · 3.0mm · 0.45mm/px · z∈[-58,+77]mm · 2 of 30 slices shown]
[im 1/30]
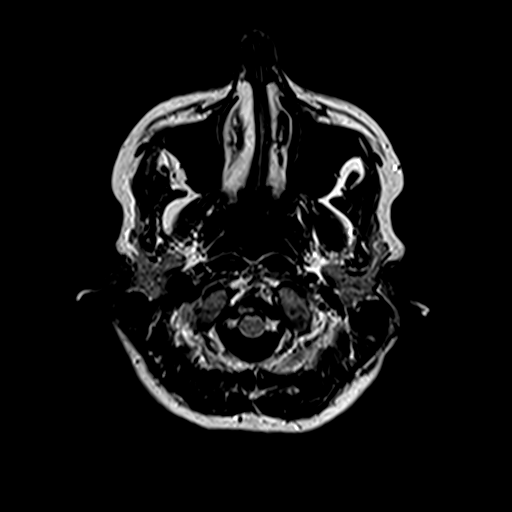
[im 30/30]
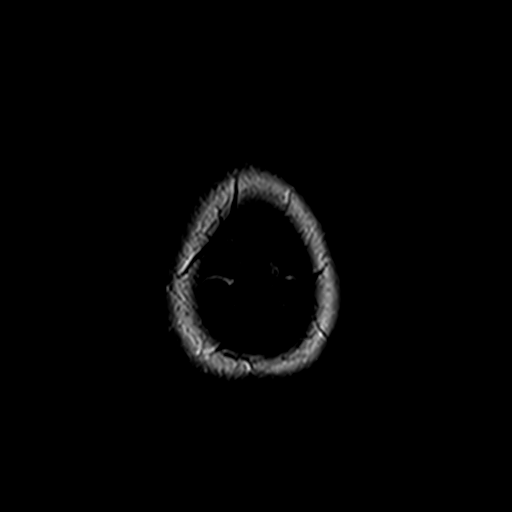

[Series 9: swi_images · axial · 2.0mm · 0.90mm/px · z∈[-61,+80]mm · 6 of 72 slices shown]
[im 1/72]
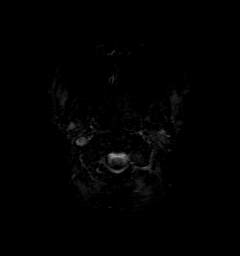
[im 15/72]
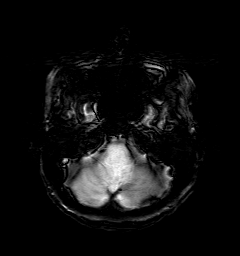
[im 29/72]
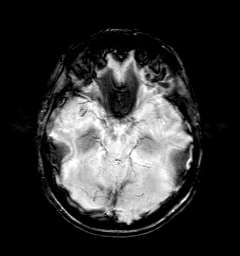
[im 43/72]
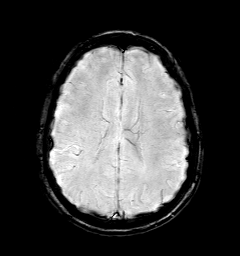
[im 57/72]
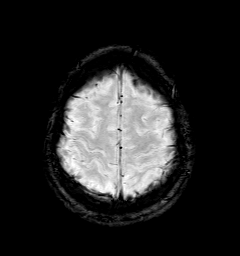
[im 72/72]
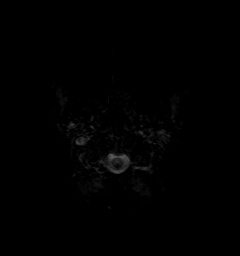

[Series 10: t1_mpr_tra · axial · 1.0mm · 0.72mm/px · z∈[-61,+81]mm · 11 of 144 slices shown (1 of 2)]
[im 1/144]
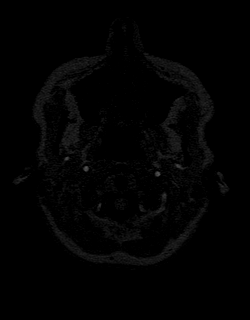
[im 15/144]
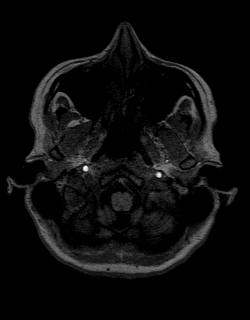
[im 29/144]
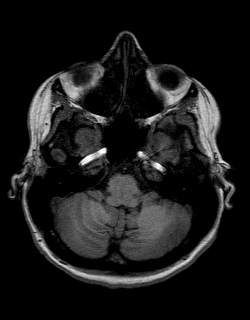
[im 43/144]
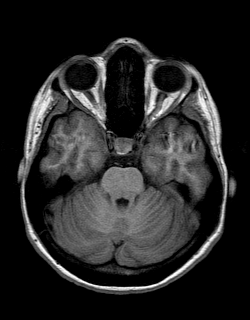
[im 58/144]
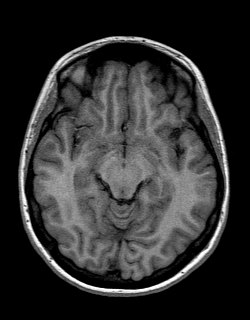
[im 72/144]
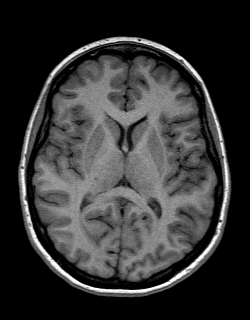
[im 86/144]
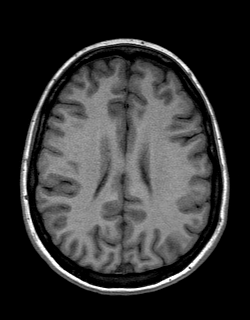
[im 101/144]
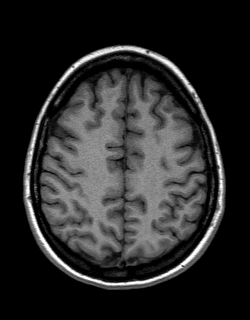
[im 115/144]
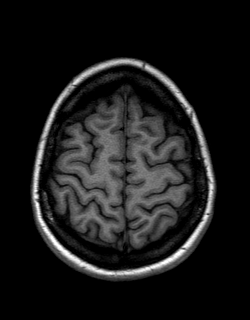
[im 129/144]
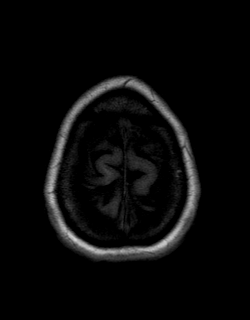
[im 144/144]
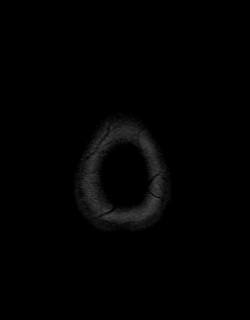

[Series 11: T2 · coronal · 5.0mm · 0.45mm/px · 2 of 25 slices shown (2 of 2)]
[im 1/25]
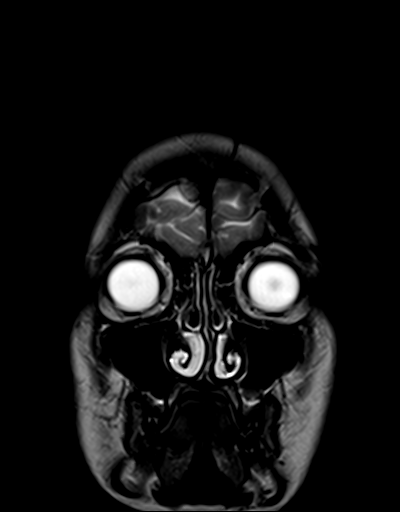
[im 25/25]
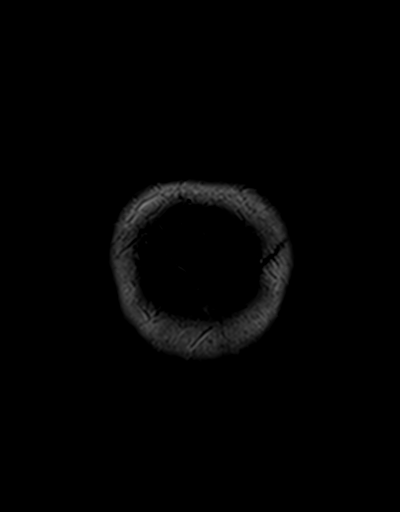

[Series 12: t1_mpr_tra · axial · 1.0mm · 0.72mm/px · z∈[-61,+81]mm · 11 of 144 slices shown (2 of 2)]
[im 1/144]
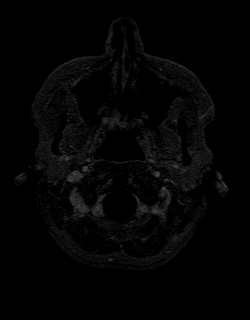
[im 15/144]
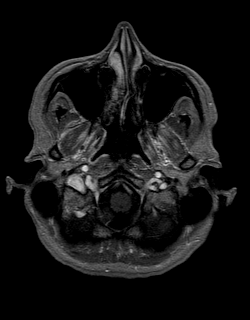
[im 29/144]
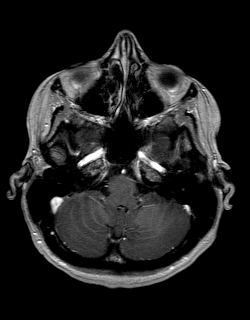
[im 43/144]
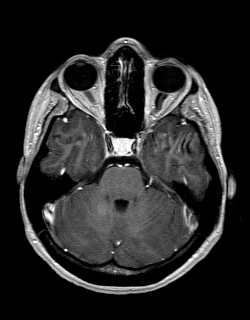
[im 58/144]
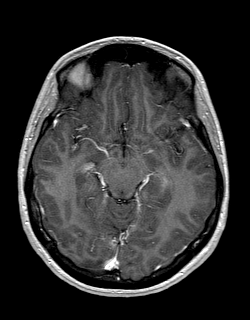
[im 72/144]
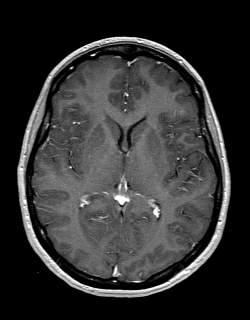
[im 86/144]
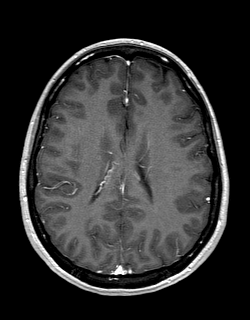
[im 101/144]
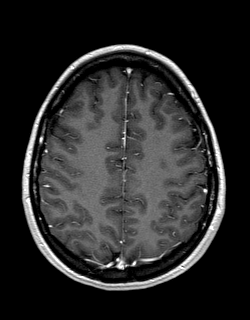
[im 115/144]
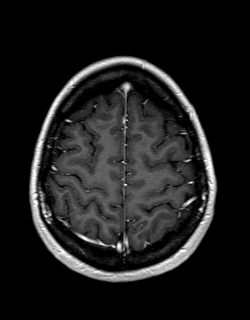
[im 129/144]
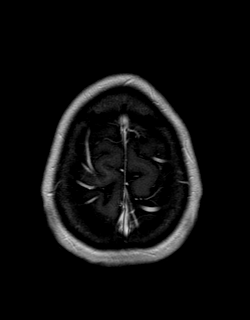
[im 144/144]
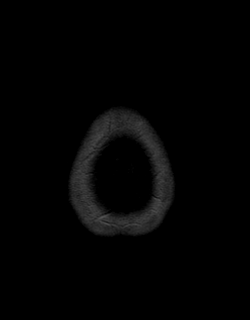

[Series 13: post cor · coronal · 5.0mm · 0.45mm/px · 2 of 25 slices shown]
[im 1/25]
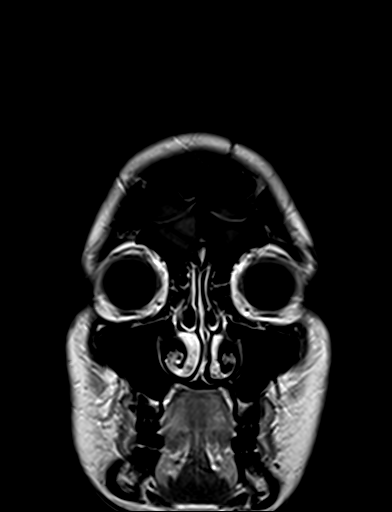
[im 25/25]
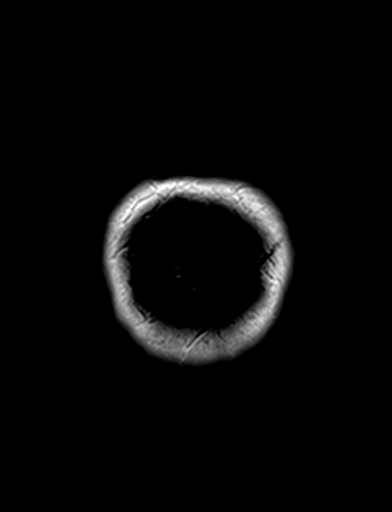

[48 of 48 positions shown; findings below may reference images not displayed]

FINDINGS: Brain: No acute infarction, hemorrhage, hydrocephalus, extra-axial
collection or mass lesion. Normal white matter. No evidence of
demyelinating disease.

Vascular: Normal arterial flow voids.

Skull and upper cervical spine: Negative

Sinuses/Orbits: Negative

Other: None
IMPRESSION: Normal MRI head

## 2021-01-09 ENCOUNTER — Ambulatory Visit: Payer: Self-pay | Admitting: Obstetrics & Gynecology

## 2021-04-08 ENCOUNTER — Other Ambulatory Visit (HOSPITAL_COMMUNITY)
Admission: RE | Admit: 2021-04-08 | Discharge: 2021-04-08 | Disposition: A | Discharge status: Home / Self Care | Payer: BC Managed Care – PPO | Source: Ambulatory Visit | Attending: Obstetrics & Gynecology | Admitting: Obstetrics & Gynecology

## 2021-04-08 ENCOUNTER — Encounter: Payer: Self-pay | Admitting: Obstetrics & Gynecology

## 2021-04-08 ENCOUNTER — Other Ambulatory Visit: Payer: Self-pay

## 2021-04-08 ENCOUNTER — Ambulatory Visit (INDEPENDENT_AMBULATORY_CARE_PROVIDER_SITE_OTHER): Payer: BC Managed Care – PPO | Admitting: Obstetrics & Gynecology

## 2021-04-08 VITALS — BP 92/60 | HR 72 | Resp 12 | Ht 65.5 in | Wt 110.0 lb

## 2021-04-08 DIAGNOSIS — Z01419 Encounter for gynecological examination (general) (routine) without abnormal findings: Secondary | ICD-10-CM | POA: Insufficient documentation

## 2021-04-08 DIAGNOSIS — Z789 Other specified health status: Secondary | ICD-10-CM

## 2021-04-08 NOTE — Progress Notes (Signed)
Crystal Wagner 03-24-1991 [id number]   History:    31 y.o. G0 Married.  [employer].   RP:  Established patient presenting for annual gyn exam    HPI: Menses regular normal every month.  No BTB.  Using condoms.  No pelvic pain.  No pain with intercourse.  Last Pap Neg in 12/2018.  Urine and bowel movements normal.  Breasts normal. Body mass index decreased to 18.03.  Good fitness.  Health labs with family MD.   Past medical history,surgical history, family history and social history were all reviewed and documented in the EPIC chart.  Gynecologic History Patient's last menstrual period was 03/28/2021.  Obstetric History OB History  Gravida Para Term Preterm AB Living  0 0 0 0 0 0  SAB IAB Ectopic Multiple Live Births  0 0 0 0 0     ROS: A ROS was performed and pertinent positives and negatives are included in the history.  GENERAL: No fevers or chills. HEENT: No change in vision, no earache, sore throat or sinus congestion. NECK: No pain or stiffness. CARDIOVASCULAR: No chest pain or pressure. No palpitations. PULMONARY: No shortness of breath, cough or wheeze. GASTROINTESTINAL: No abdominal pain, nausea, vomiting or diarrhea, melena or bright red blood per rectum. GENITOURINARY: No urinary frequency, urgency, hesitancy or dysuria. MUSCULOSKELETAL: No joint or muscle pain, no back pain, no recent trauma. DERMATOLOGIC: No rash, no itching, no lesions. ENDOCRINE: No polyuria, polydipsia, no heat or cold intolerance. No recent change in weight. HEMATOLOGICAL: No anemia or easy bruising or bleeding. NEUROLOGIC: No headache, seizures, numbness, tingling or weakness. PSYCHIATRIC: No depression, no loss of interest in normal activity or change in sleep pattern.     Exam:   BP 92/60 (BP Location: Left Arm, Patient Position: Sitting, Cuff Size: Normal)    Pulse 72    Resp 12    Ht 5' 5.5" (1.664 m)    Wt 110 lb (49.9 kg)    LMP 03/28/2021    BMI 18.03 kg/m   Body mass index is  18.03 kg/m.  General appearance : Well developed well nourished female. No acute distress HEENT: Eyes: no retinal hemorrhage or exudates,  Neck supple, trachea midline, no carotid bruits, no thyroidmegaly Lungs: Clear to auscultation, no rhonchi or wheezes, or rib retractions  Heart: Regular rate and rhythm, no murmurs or gallops Breast:Examined in sitting and supine position were symmetrical in appearance, no palpable masses or tenderness,  no skin retraction, no nipple inversion, no nipple discharge, no skin discoloration, no axillary or supraclavicular lymphadenopathy Abdomen: no palpable masses or tenderness, no rebound or guarding Extremities: no edema or skin discoloration or tenderness  Pelvic: Vulva: Normal             Vagina: No gross lesions or discharge  Cervix: No gross lesions or discharge.  Pap reflex done.  Uterus  AV, normal size, shape and consistency, non-tender and mobile  Adnexa  Without masses or tenderness  Anus: Normal   Assessment/Plan:  31 y.o. female for annual exam   1. Encounter for routine gynecological examination with Papanicolaou smear of cervix Menses regular normal every month.  No BTB.  Using condoms.  No pelvic pain.  No pain with intercourse.  Last Pap Neg in 12/2018.  Pap reflex done today.  Urine and bowel movements normal.  Breasts normal. Body mass index decreased to 18.03.  Good fitness.  Health labs with family MD. - Cytology - PAP( [place])  2. Use of  condoms for contraception   Genia Del MD, 2:45 PM 04/08/2021

## 2021-04-09 LAB — CYTOLOGY - PAP: Diagnosis: NEGATIVE

## 2021-05-16 DIAGNOSIS — L814 Other melanin hyperpigmentation: Secondary | ICD-10-CM | POA: Diagnosis not present

## 2021-05-16 DIAGNOSIS — L821 Other seborrheic keratosis: Secondary | ICD-10-CM | POA: Diagnosis not present

## 2021-05-16 DIAGNOSIS — D485 Neoplasm of uncertain behavior of skin: Secondary | ICD-10-CM | POA: Diagnosis not present

## 2021-05-16 DIAGNOSIS — D225 Melanocytic nevi of trunk: Secondary | ICD-10-CM | POA: Diagnosis not present

## 2021-07-29 DIAGNOSIS — D225 Melanocytic nevi of trunk: Secondary | ICD-10-CM | POA: Diagnosis not present

## 2021-09-24 DIAGNOSIS — D225 Melanocytic nevi of trunk: Secondary | ICD-10-CM | POA: Diagnosis not present

## 2022-04-10 ENCOUNTER — Ambulatory Visit: Payer: BC Managed Care – PPO | Admitting: Obstetrics & Gynecology

## 2022-06-11 ENCOUNTER — Ambulatory Visit: Payer: BC Managed Care – PPO | Admitting: Obstetrics & Gynecology

## 2022-08-17 ENCOUNTER — Encounter: Payer: Self-pay | Admitting: Obstetrics & Gynecology

## 2022-08-17 ENCOUNTER — Ambulatory Visit (INDEPENDENT_AMBULATORY_CARE_PROVIDER_SITE_OTHER): Payer: BC Managed Care – PPO | Admitting: Obstetrics & Gynecology

## 2022-08-17 VITALS — BP 108/68 | HR 73 | Ht 66.0 in | Wt 112.0 lb

## 2022-08-17 DIAGNOSIS — Z01419 Encounter for gynecological examination (general) (routine) without abnormal findings: Secondary | ICD-10-CM

## 2022-08-17 DIAGNOSIS — Z789 Other specified health status: Secondary | ICD-10-CM

## 2022-08-17 NOTE — Progress Notes (Signed)
   Crystal Wagner [birth year] [id number]   History:    32 y.o.  G0 Married.  [employer].   RP:  Established patient presenting for annual gyn exam    HPI: Menses regular normal every month.  No BTB.  Using condoms. No pelvic pain.  No pain with intercourse.  Last Pap Neg in 03/2021. Will repeat at 4 years. Urine and bowel movements normal.  Breasts normal. Body mass index decreased to 18.08. Good fitness.  Health labs with family MD.   Past medical history,surgical history, family history and social history were all reviewed and documented in the EPIC chart.  Gynecologic History Patient's last menstrual period was 08/06/2022.  Obstetric History OB History  Gravida Para Term Preterm AB Living  0 0 0 0 0 0  SAB IAB Ectopic Multiple Live Births  0 0 0 0 0    ROS: A ROS was performed and pertinent positives and negatives are included in the history. GENERAL: No fevers or chills. HEENT: No change in vision, no earache, sore throat or sinus congestion. NECK: No pain or stiffness. CARDIOVASCULAR: No chest pain or pressure. No palpitations. PULMONARY: No shortness of breath, cough or wheeze. GASTROINTESTINAL: No abdominal pain, nausea, vomiting or diarrhea, melena or bright red blood per rectum. GENITOURINARY: No urinary frequency, urgency, hesitancy or dysuria. MUSCULOSKELETAL: No joint or muscle pain, no back pain, no recent trauma. DERMATOLOGIC: No rash, no itching, no lesions. ENDOCRINE: No polyuria, polydipsia, no heat or cold intolerance. No recent change in weight. HEMATOLOGICAL: No anemia or easy bruising or bleeding. NEUROLOGIC: No headache, seizures, numbness, tingling or weakness. PSYCHIATRIC: No depression, no loss of interest in normal activity or change in sleep pattern.     Exam:   BP 108/68 (BP Location: Right Arm, Patient Position: Sitting, Cuff Size: Normal)   Pulse 73   Ht 5\' 6"  (1.676 m)   Wt 112 lb (50.8 kg)   LMP 08/06/2022   SpO2 96%   BMI 18.08 kg/m   Body  mass index is 18.08 kg/m.  General appearance : Well developed well nourished female. No acute distress HEENT: Eyes: no retinal hemorrhage or exudates,  Neck supple, trachea midline, no carotid bruits, no thyroidmegaly Lungs: Clear to auscultation, no rhonchi or wheezes, or rib retractions  Heart: Regular rate and rhythm, no murmurs or gallops Breast:Examined in sitting and supine position were symmetrical in appearance, no palpable masses or tenderness,  no skin retraction, no nipple inversion, no nipple discharge, no skin discoloration, no axillary or supraclavicular lymphadenopathy Abdomen: no palpable masses or tenderness, no rebound or guarding Extremities: no edema or skin discoloration or tenderness  Pelvic: Vulva: Normal             Vagina: No gross lesions or discharge  Cervix: No gross lesions or discharge  Uterus  AV, normal size, shape and consistency, non-tender and mobile  Adnexa  Without masses or tenderness  Anus: Normal  Assessment/Plan:  32 y.o. female for annual exam   1. Well female exam with routine gynecological exam Menses regular normal every month.  No BTB.  Using condoms. No pelvic pain.  No pain with intercourse.  Last Pap Neg in 03/2021. Will repeat at 4 years. Urine and bowel movements normal.  Breasts normal. Body mass index decreased to 18.08. Good fitness.  Health labs with family MD.  2. Use of condoms for contraception   Crystal Del MD, 3:36 PM
